# Patient Record
Sex: Female | Born: 1990 | Race: Black or African American | Hispanic: No | Marital: Single | Smoking: Light tobacco smoker
Health system: Southern US, Community
[De-identification: ages and names within clinical notes are randomized; demographics above are authoritative.]

## PROBLEM LIST (undated history)

## (undated) DIAGNOSIS — F329 Major depressive disorder, single episode, unspecified: Secondary | ICD-10-CM

## (undated) DIAGNOSIS — K7460 Unspecified cirrhosis of liver: Secondary | ICD-10-CM

## (undated) DIAGNOSIS — I2699 Other pulmonary embolism without acute cor pulmonale: Secondary | ICD-10-CM

## (undated) DIAGNOSIS — A4902 Methicillin resistant Staphylococcus aureus infection, unspecified site: Secondary | ICD-10-CM

## (undated) DIAGNOSIS — N289 Disorder of kidney and ureter, unspecified: Secondary | ICD-10-CM

## (undated) DIAGNOSIS — I639 Cerebral infarction, unspecified: Secondary | ICD-10-CM

## (undated) DIAGNOSIS — F1410 Cocaine abuse, uncomplicated: Secondary | ICD-10-CM

## (undated) DIAGNOSIS — I509 Heart failure, unspecified: Secondary | ICD-10-CM

## (undated) DIAGNOSIS — F32A Depression, unspecified: Secondary | ICD-10-CM

## (undated) DIAGNOSIS — Z720 Tobacco use: Secondary | ICD-10-CM

## (undated) DIAGNOSIS — Q240 Dextrocardia: Secondary | ICD-10-CM

## (undated) HISTORY — PX: OTHER SURGICAL HISTORY: SHX169

## (undated) HISTORY — PX: FONTAN PROCEDURE, INTRACARDIAC: SHX1654

## (undated) HISTORY — DX: Major depressive disorder, single episode, unspecified: F32.9

## (undated) HISTORY — DX: Cocaine abuse, uncomplicated: F14.10

## (undated) HISTORY — DX: Depression, unspecified: F32.A

## (undated) HISTORY — PX: APPENDECTOMY: SHX54

## (undated) HISTORY — PX: CYST EXCISION: SHX5701

## (undated) HISTORY — DX: Unspecified cirrhosis of liver: K74.60

## (undated) HISTORY — DX: Other pulmonary embolism without acute cor pulmonale: I26.99

## (undated) HISTORY — DX: Tobacco use: Z72.0

---

## 2010-12-20 DIAGNOSIS — Z9889 Other specified postprocedural states: Secondary | ICD-10-CM | POA: Insufficient documentation

## 2010-12-20 DIAGNOSIS — Q220 Pulmonary valve atresia: Secondary | ICD-10-CM | POA: Insufficient documentation

## 2011-02-02 DIAGNOSIS — Q203 Discordant ventriculoarterial connection: Secondary | ICD-10-CM | POA: Insufficient documentation

## 2011-12-04 ENCOUNTER — Emergency Department: Payer: Self-pay | Admitting: Emergency Medicine

## 2011-12-04 LAB — URINALYSIS, COMPLETE
Glucose,UR: NEGATIVE mg/dL (ref 0–75)
Ph: 5 (ref 4.5–8.0)
Protein: 30
Specific Gravity: 1.025 (ref 1.003–1.030)
Squamous Epithelial: 5

## 2011-12-04 LAB — CBC
HCT: 39.4 % (ref 35.0–47.0)
HGB: 12.5 g/dL (ref 12.0–16.0)
MCH: 24.4 pg — ABNORMAL LOW (ref 26.0–34.0)
MCV: 77 fL — ABNORMAL LOW (ref 80–100)
Platelet: 335 10*3/uL (ref 150–440)

## 2011-12-04 LAB — PREGNANCY, URINE: Pregnancy Test, Urine: NEGATIVE m[IU]/mL

## 2011-12-04 LAB — CK TOTAL AND CKMB (NOT AT ARMC)
CK, Total: 23 U/L (ref 21–215)
CK, Total: 97 U/L (ref 21–215)

## 2011-12-04 LAB — BASIC METABOLIC PANEL
BUN: 10 mg/dL (ref 7–18)
Calcium, Total: 8.4 mg/dL — ABNORMAL LOW (ref 8.5–10.1)
Co2: 22 mmol/L (ref 21–32)
EGFR (African American): >60
Sodium: 143 mmol/L (ref 136–145)

## 2011-12-04 LAB — TROPONIN I: Troponin-I: <0.02 ng/mL

## 2011-12-05 LAB — URINE CULTURE

## 2012-05-20 DIAGNOSIS — I2699 Other pulmonary embolism without acute cor pulmonale: Secondary | ICD-10-CM | POA: Insufficient documentation

## 2012-08-19 LAB — COMPREHENSIVE METABOLIC PANEL
Albumin: 2.6 g/dL — ABNORMAL LOW (ref 3.4–5.0)
Anion Gap: 7 (ref 7–16)
Chloride: 106 mmol/L (ref 98–107)
Co2: 23 mmol/L (ref 21–32)
Creatinine: 0.99 mg/dL (ref 0.60–1.30)
EGFR (African American): >60
EGFR (Non-African Amer.): >60
Osmolality: 271 (ref 275–301)
SGOT(AST): 25 U/L (ref 15–37)
SGPT (ALT): 15 U/L (ref 12–78)
Total Protein: 7.8 g/dL (ref 6.4–8.2)

## 2012-08-19 LAB — TSH: Thyroid Stimulating Horm: 1.82 u[IU]/mL

## 2012-08-19 LAB — URINALYSIS, COMPLETE
Glucose,UR: NEGATIVE mg/dL (ref 0–75)
Hyaline Cast: 34
Ketone: NEGATIVE
RBC,UR: 41 /HPF (ref 0–5)
Squamous Epithelial: 20

## 2012-08-19 LAB — CBC WITH DIFFERENTIAL/PLATELET
Basophil: 1 %
Eosinophil: 4 %
HCT: 25 % — ABNORMAL LOW (ref 35.0–47.0)
HGB: 7.4 g/dL — ABNORMAL LOW (ref 12.0–16.0)
MCH: 20.2 pg — ABNORMAL LOW (ref 26.0–34.0)
MCV: 68 fL — ABNORMAL LOW (ref 80–100)
NRBC/100 WBC: 59 /
RBC: 3.67 10*6/uL — ABNORMAL LOW (ref 3.80–5.20)
Segmented Neutrophils: 64 %

## 2012-08-19 LAB — TROPONIN I: Troponin-I: <0.02 ng/mL

## 2012-08-19 LAB — PREGNANCY, URINE: Pregnancy Test, Urine: NEGATIVE m[IU]/mL

## 2012-08-19 LAB — PROTIME-INR: INR: 4.3

## 2012-08-20 ENCOUNTER — Inpatient Hospital Stay: Payer: Self-pay | Admitting: Student

## 2012-08-20 LAB — CK TOTAL AND CKMB (NOT AT ARMC): CK-MB: 1.3 ng/mL (ref 0.5–3.6)

## 2012-08-20 LAB — TROPONIN I: Troponin-I: <0.02 ng/mL

## 2012-08-20 LAB — CBC WITH DIFFERENTIAL/PLATELET
Bands: 1 %
Eosinophil: 2 %
HCT: 26.7 % — ABNORMAL LOW (ref 35.0–47.0)
MCH: 21.3 pg — ABNORMAL LOW (ref 26.0–34.0)
MCHC: 29.8 g/dL — ABNORMAL LOW (ref 32.0–36.0)
MCV: 72 fL — ABNORMAL LOW (ref 80–100)
Monocytes: 10 %
Platelet: 324 10*3/uL (ref 150–440)
RDW: 25.3 % — ABNORMAL HIGH (ref 11.5–14.5)
Segmented Neutrophils: 65 %

## 2012-08-20 LAB — LIPASE, BLOOD: Lipase: 33 U/L — ABNORMAL LOW (ref 73–393)

## 2012-08-20 LAB — CK-MB: CK-MB: 2.1 ng/mL (ref 0.5–3.6)

## 2012-08-21 LAB — MAGNESIUM: Magnesium: 1.7 mg/dL — ABNORMAL LOW

## 2012-08-21 LAB — CBC WITH DIFFERENTIAL/PLATELET
Eosinophil: 2 %
HGB: 8.6 g/dL — ABNORMAL LOW (ref 12.0–16.0)
MCH: 21.6 pg — ABNORMAL LOW (ref 26.0–34.0)
MCHC: 30.6 g/dL — ABNORMAL LOW (ref 32.0–36.0)
MCV: 71 fL — ABNORMAL LOW (ref 80–100)
Monocytes: 8 %
NRBC/100 WBC: 51 /
Platelet: 353 10*3/uL (ref 150–440)
RBC: 3.99 10*6/uL (ref 3.80–5.20)
RDW: 25.5 % — ABNORMAL HIGH (ref 11.5–14.5)
Segmented Neutrophils: 70 %

## 2012-08-21 LAB — BASIC METABOLIC PANEL
Anion Gap: 5 — ABNORMAL LOW (ref 7–16)
Calcium, Total: 7.7 mg/dL — ABNORMAL LOW (ref 8.5–10.1)
Chloride: 108 mmol/L — ABNORMAL HIGH (ref 98–107)
Creatinine: 0.79 mg/dL (ref 0.60–1.30)
EGFR (Non-African Amer.): >60
Glucose: 73 mg/dL (ref 65–99)
Osmolality: 268 (ref 275–301)

## 2012-08-21 LAB — LIPID PANEL
Cholesterol: 61 mg/dL (ref 0–200)
HDL Cholesterol: 11 mg/dL — ABNORMAL LOW (ref 40–60)
Ldl Cholesterol, Calc: 38 mg/dL (ref 0–100)
Triglycerides: 58 mg/dL (ref 0–200)

## 2012-08-21 LAB — PROTIME-INR: Prothrombin Time: 30.6 secs — ABNORMAL HIGH (ref 11.5–14.7)

## 2012-08-21 LAB — TSH: Thyroid Stimulating Horm: 2.13 u[IU]/mL

## 2012-08-22 LAB — PROTIME-INR: INR: 3

## 2012-08-23 ENCOUNTER — Ambulatory Visit (HOSPITAL_COMMUNITY)
Admission: AD | Admit: 2012-08-23 | Discharge: 2012-08-23 | Disposition: A | Discharge status: Home / Self Care | Payer: Self-pay | Source: Other Acute Inpatient Hospital | Attending: Student | Admitting: Student

## 2012-08-23 DIAGNOSIS — R55 Syncope and collapse: Secondary | ICD-10-CM | POA: Insufficient documentation

## 2012-08-23 DIAGNOSIS — D649 Anemia, unspecified: Secondary | ICD-10-CM | POA: Insufficient documentation

## 2012-08-25 LAB — CULTURE, BLOOD (SINGLE)

## 2013-10-21 LAB — URINALYSIS, COMPLETE
BACTERIA: NONE SEEN
Bilirubin,UR: NEGATIVE
Blood: NEGATIVE
Glucose,UR: NEGATIVE mg/dL (ref 0–75)
KETONE: NEGATIVE
Leukocyte Esterase: NEGATIVE
NITRITE: NEGATIVE
Ph: 5 (ref 4.5–8.0)
Protein: 100
Specific Gravity: 1.028 (ref 1.003–1.030)

## 2013-10-21 LAB — BASIC METABOLIC PANEL
Anion Gap: 8 (ref 7–16)
BUN: 15 mg/dL (ref 7–18)
CHLORIDE: 111 mmol/L — AB (ref 98–107)
CREATININE: 0.98 mg/dL (ref 0.60–1.30)
Calcium, Total: 8.7 mg/dL (ref 8.5–10.1)
Co2: 18 mmol/L — ABNORMAL LOW (ref 21–32)
EGFR (African American): >60
EGFR (Non-African Amer.): >60
GLUCOSE: 82 mg/dL (ref 65–99)
Osmolality: 274 (ref 275–301)
Potassium: 4.7 mmol/L (ref 3.5–5.1)
Sodium: 137 mmol/L (ref 136–145)

## 2013-10-21 LAB — HEPATIC FUNCTION PANEL A (ARMC)
AST: 61 U/L — AB (ref 15–37)
Albumin: 3.6 g/dL (ref 3.4–5.0)
Alkaline Phosphatase: 133 U/L — ABNORMAL HIGH
BILIRUBIN DIRECT: 0.4 mg/dL — AB (ref 0.00–0.20)
Bilirubin,Total: 1.5 mg/dL — ABNORMAL HIGH (ref 0.2–1.0)
SGPT (ALT): 21 U/L
Total Protein: 8.3 g/dL — ABNORMAL HIGH (ref 6.4–8.2)

## 2013-10-21 LAB — RAPID HIV SCREEN (HIV 1/2 AB+AG)

## 2013-10-21 LAB — CBC
HCT: 40.5 % (ref 35.0–47.0)
HGB: 11.6 g/dL — AB (ref 12.0–16.0)
MCH: 20.3 pg — ABNORMAL LOW (ref 26.0–34.0)
MCHC: 28.8 g/dL — ABNORMAL LOW (ref 32.0–36.0)
MCV: 71 fL — AB (ref 80–100)
Platelet: 334 10*3/uL (ref 150–440)
RBC: 5.74 10*6/uL — AB (ref 3.80–5.20)
RDW: 24.9 % — ABNORMAL HIGH (ref 11.5–14.5)
WBC: 4.2 10*3/uL (ref 3.6–11.0)

## 2013-10-21 LAB — LIPASE, BLOOD: LIPASE: 62 U/L — AB (ref 73–393)

## 2013-10-21 LAB — TROPONIN I: Troponin-I: 0.02 ng/mL

## 2013-10-22 ENCOUNTER — Inpatient Hospital Stay: Payer: Self-pay | Admitting: Internal Medicine

## 2013-10-22 ENCOUNTER — Ambulatory Visit: Payer: Self-pay | Admitting: Neurology

## 2013-10-22 LAB — PROTIME-INR
INR: 1.7
Prothrombin Time: 19.7 secs — ABNORMAL HIGH (ref 11.5–14.7)

## 2013-10-22 LAB — DRUG SCREEN, URINE
Amphetamines, Ur Screen: NEGATIVE (ref ?–1000)
Barbiturates, Ur Screen: NEGATIVE (ref ?–200)
Benzodiazepine, Ur Scrn: NEGATIVE (ref ?–200)
Cannabinoid 50 Ng, Ur ~~LOC~~: POSITIVE (ref ?–50)
Cocaine Metabolite,Ur ~~LOC~~: NEGATIVE (ref ?–300)
MDMA (Ecstasy)Ur Screen: NEGATIVE (ref ?–500)
Methadone, Ur Screen: NEGATIVE (ref ?–300)
Opiate, Ur Screen: NEGATIVE (ref ?–300)
Phencyclidine (PCP) Ur S: NEGATIVE (ref ?–25)
Tricyclic, Ur Screen: NEGATIVE (ref ?–1000)

## 2013-10-22 LAB — TSH: Thyroid Stimulating Horm: 1.37 u[IU]/mL

## 2013-10-23 LAB — PROTIME-INR
INR: 1.8
Prothrombin Time: 20.5 secs — ABNORMAL HIGH (ref 11.5–14.7)

## 2013-10-23 LAB — URINE CULTURE

## 2013-11-02 ENCOUNTER — Emergency Department: Payer: Self-pay | Admitting: Emergency Medicine

## 2013-11-02 ENCOUNTER — Ambulatory Visit (HOSPITAL_COMMUNITY)
Admission: AD | Admit: 2013-11-02 | Discharge: 2013-11-02 | Disposition: A | Discharge status: Home / Self Care | Payer: Medicaid Other | Source: Other Acute Inpatient Hospital | Attending: Emergency Medicine | Admitting: Emergency Medicine

## 2013-11-02 DIAGNOSIS — I2699 Other pulmonary embolism without acute cor pulmonale: Secondary | ICD-10-CM | POA: Insufficient documentation

## 2013-11-02 LAB — BASIC METABOLIC PANEL
ANION GAP: 17 — AB (ref 7–16)
BUN: 13 mg/dL (ref 7–18)
CALCIUM: 8.9 mg/dL (ref 8.5–10.1)
CHLORIDE: 104 mmol/L (ref 98–107)
CO2: 16 mmol/L — AB (ref 21–32)
CREATININE: 0.9 mg/dL (ref 0.60–1.30)
EGFR (African American): >60
Glucose: 73 mg/dL (ref 65–99)
Osmolality: 273 (ref 275–301)
POTASSIUM: 4.3 mmol/L (ref 3.5–5.1)
Sodium: 137 mmol/L (ref 136–145)

## 2013-11-02 LAB — PRO B NATRIURETIC PEPTIDE: B-Type Natriuretic Peptide: 598 pg/mL — ABNORMAL HIGH (ref 0–125)

## 2013-11-02 LAB — CBC
HCT: 41.6 % (ref 35.0–47.0)
HGB: 11.7 g/dL — ABNORMAL LOW (ref 12.0–16.0)
MCH: 19.8 pg — AB (ref 26.0–34.0)
MCHC: 28.1 g/dL — AB (ref 32.0–36.0)
MCV: 71 fL — ABNORMAL LOW (ref 80–100)
Platelet: 259 10*3/uL (ref 150–440)
RBC: 5.91 10*6/uL — ABNORMAL HIGH (ref 3.80–5.20)
RDW: 25.5 % — ABNORMAL HIGH (ref 11.5–14.5)
WBC: 4.4 10*3/uL (ref 3.6–11.0)

## 2013-11-02 LAB — URINALYSIS, COMPLETE
BLOOD: NEGATIVE
Bilirubin,UR: NEGATIVE
Glucose,UR: NEGATIVE mg/dL (ref 0–75)
Ketone: NEGATIVE
LEUKOCYTE ESTERASE: NEGATIVE
Nitrite: NEGATIVE
Ph: 5 (ref 4.5–8.0)
Protein: 30
Squamous Epithelial: 10

## 2013-11-02 LAB — APTT: ACTIVATED PTT: 38.3 s — AB (ref 23.6–35.9)

## 2013-11-02 LAB — TROPONIN I: Troponin-I: 0.03 ng/mL

## 2013-11-02 LAB — PROTIME-INR
INR: 2
Prothrombin Time: 22.4 secs — ABNORMAL HIGH (ref 11.5–14.7)

## 2014-03-28 DIAGNOSIS — F1410 Cocaine abuse, uncomplicated: Secondary | ICD-10-CM | POA: Insufficient documentation

## 2014-03-28 DIAGNOSIS — R079 Chest pain, unspecified: Secondary | ICD-10-CM | POA: Insufficient documentation

## 2014-03-30 DIAGNOSIS — F17200 Nicotine dependence, unspecified, uncomplicated: Secondary | ICD-10-CM | POA: Insufficient documentation

## 2014-04-04 ENCOUNTER — Emergency Department: Payer: Self-pay | Admitting: Emergency Medicine

## 2014-04-07 ENCOUNTER — Emergency Department: Payer: Self-pay | Admitting: Emergency Medicine

## 2014-05-20 ENCOUNTER — Emergency Department
Admit: 2014-05-20 | Disposition: A | Discharge status: Home / Self Care | Payer: Self-pay | Admitting: Emergency Medicine

## 2014-05-20 LAB — BASIC METABOLIC PANEL
Anion Gap: 7 (ref 7–16)
BUN: 16 mg/dL
CHLORIDE: 107 mmol/L
CREATININE: 0.57 mg/dL
Calcium, Total: 9.3 mg/dL
Co2: 22 mmol/L
Glucose: 98 mg/dL
Potassium: 4.1 mmol/L
Sodium: 136 mmol/L

## 2014-05-20 LAB — CBC WITH DIFFERENTIAL/PLATELET
Basophil #: 0.1 10*3/uL (ref 0.0–0.1)
Basophil %: 1.2 %
EOS ABS: 0.2 10*3/uL (ref 0.0–0.7)
Eosinophil %: 3.1 %
HCT: 46.9 % (ref 35.0–47.0)
HGB: 14.9 g/dL (ref 12.0–16.0)
Lymphocyte #: 1.7 10*3/uL (ref 1.0–3.6)
Lymphocyte %: 32.1 %
MCH: 29.1 pg (ref 26.0–34.0)
MCHC: 31.7 g/dL — AB (ref 32.0–36.0)
MCV: 92 fL (ref 80–100)
MONO ABS: 0.4 x10 3/mm (ref 0.2–0.9)
Monocyte %: 7.5 %
NEUTROS ABS: 2.9 10*3/uL (ref 1.4–6.5)
Neutrophil %: 56.1 %
Platelet: 299 10*3/uL (ref 150–440)
RBC: 5.11 10*6/uL (ref 3.80–5.20)
RDW: 17.5 % — ABNORMAL HIGH (ref 11.5–14.5)
WBC: 5.3 10*3/uL (ref 3.6–11.0)

## 2014-05-24 DIAGNOSIS — Z9889 Other specified postprocedural states: Secondary | ICD-10-CM | POA: Insufficient documentation

## 2014-06-12 NOTE — Consult Note (Signed)
[name] is a 24 year old African American single female, P0, LMP 08/01/12, using condoms as birthcontrol.  Patient [facility] yesterday morning complaining of chest pain, syncope, and shortness of breath.  [facility] was [facility] hospitalist for menorrhagia and syncope.  Patient states LMP begain on 08/01/12 and lasted for 14 days with heavy bleeding.  Patient states prior to LMP, her cycles have been regular every 4 weeks with minimal blood loss.  Age of menarche [age].  At this time, patient afebrile and denies blood loss.  Patient denies dysmenorrhea, abdominal and/or pelvic pain, vaginal discharge, hot flashes, or sweats.  Patient denies previous STI exposure.  Patient states she has never seen an OB/GYN.is s/p transfusion 1 unit prbc's. 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 History: negative History: Student. Lives with mother.  Smokes 1 pack of cigarettes per week.  Denies alcohol, illicit drugs. Xarelto 20mg  q day, spironolactone 25 1/2 tablet q day, Lasix 20mg  q day, citalopram 40mg  q day, Coreg 1 tab BID NKDA Labs: RBC 3.73, Hmg, 8, Hct 26.7, platelets 324, urine pregnancy screen negative   PE: pending ([place] in [place]) Order pelvic USBegin iron supplement alone or with multivitaminConsider progestin hormone supplementation (i.e. Mirena, Depot Provera, Nexplanon) to prevent recurrence.Continue antibiotics for UTI; check Urine C&S.   Electronic Signatures: DeFrancesco, Prentice DockerMartin A (MD) (Signed on 01-Jul-14 17:47)  Ernesto RutherfordAuthored  Auriel Kist (NP) (Signed on 01-Jul-14 17:04)  Authored   Last Updated: 01-Jul-14 17:47 by DeFrancesco, Prentice DockerMartin A (MD)

## 2014-06-12 NOTE — Consult Note (Signed)
Chief Complaint:  Subjective/Chief Complaint Pt notes upper abdominal cramps 8/10.  Denies N/V or rectal bleeding.  No BM in 2 days.  Immune to Hep A/B.  AFP normal.   VITAL SIGNS/ANCILLARY NOTES: **Vital Signs.:   03-Jul-14 07:55  Vital Signs Type Routine  Temperature Temperature (F) 98.4  Celsius 36.8  Temperature Source oral  Pulse Pulse 72  Respirations Respirations 16  Systolic BP Systolic BP 107  Diastolic BP (mmHg) Diastolic BP (mmHg) 59  Mean BP 75  Pulse Ox % Pulse Ox % 92  Pulse Ox Activity Level  At rest  Oxygen Delivery 3L   Brief Assessment:  GEN well developed, well nourished, no acute distress, A/Ox3   Cardiac Regular   Respiratory normal resp effort   Gastrointestinal details normal Soft  Nondistended  No masses palpable  Bowel sounds normal  No rebound tenderness  No gaurding  No rigidity  +mild TTP entire abdomen   EXTR negative edema   Additional Physical Exam Skin: warm, dry, multiple hyperpigmented lesions, tattoos   Lab Results: Routine Chem:  03-Jul-14 03:56   Result Comment HGB - SLIDE PREVIOUSLY REVIEWED BY PATHOLOGIST  - FOR NRBCs  Result(s) reported on 22 Aug 2012 at 09:04AM.  Routine Coag:  03-Jul-14 03:56   Prothrombin  29.9  INR 3.0 (INR reference interval applies to patients on anticoagulant therapy. A single INR therapeutic range for coumarins is not optimal for all indications; however, the suggested range for most indications is 2.0 - 3.0. Exceptions to the INR Reference Range may include: Prosthetic heart valves, acute myocardial infarction, prevention of myocardial infarction, and combinations of aspirin and anticoagulant. The need for a higher or lower target INR must be assessed individually. Reference: The Pharmacology and Management of the Vitamin K  antagonists: the seventh ACCP Conference on Antithrombotic and Thrombolytic Therapy. Chest.2004 Sept:126 (3suppl): [id number]. A HCT value >55% may artifactually increase the  PT.  In one study,  the increase was an average of 25%. Reference:  "Effect on Routine and Special Coagulation Testing Values of Citrate Anticoagulant Adjustment in Patients with High HCT Values." American Journal of Clinical Pathology 2006;126:400-405.)  Routine Hem:  03-Jul-14 03:56   Hemoglobin (CBC)  9.5   Radiology Results: CT:    01-Jul-14 22:44, CT Abdomen and Pelvis With Contrast  CT Abdomen and Pelvis With Contrast   REASON FOR EXAM:    (1) evaluate liver, ascites, nausea, vomiting, HSM;   (2) anemia, N/V, pelvic asci  COMMENTS:       PROCEDURE: CT  - CT ABDOMEN / PELVIS  W  - Aug 20 2012 10:44PM     RESULT: Comparison:  None    Technique: Multiple axial images of the abdomen and pelvis were performed   from the lung bases to the pubic symphysis, without p.o. contrast and   with 100 mL of Isovue 300 intravenous contrast.    Findings:  Mild basilar opacities are likely secondary to atelectasis. There are   trace bilateral pleural effusions, right greater than left. There is     dextrocardia. A graft is seen from the IVC extending superiorly beyond   the field-of-view which was shown to extend to the pulmonary artery on   recent prior CT. There is hypodensity within the graft which could   related to mixing artifact versus at least partial thrombosis.    The liver is enlarged and demonstrates diffuse heterogeneous enhancement.   This could related to hepatic venous congestion, as can be seen with  nutmeg liver. There are multiple small hyperenhancing nodules throughout   the liver. The largest measures 12 mm in diameter. The main portal vein   is patent. No definite evidence of hepatic venous thrombosis on the   delayed images.    There is a moderate amount of ascites. The gallbladder, adrenals, and   pancreas are unremarkable. The spleen is absent. The kidneys enhance   normally.  The colon is in the left hemiabdomen with small bowel in the right   hemiabdomen,  consistent with rotation. No evidence of volvulus. There is   a small tubular structure in the left adnexa which measures approximately   2.5 x 1.2 cm. This could represent hydrosalpinx. Excreted contrast   material is seen within the bladder from recent prior CT. There is mild   thickening of the bladder wall which could be secondary to lack of   distention.    No aggressive lytic or sclerotic osseous lesions are identified.    IMPRESSION:   1. The liver is diffusely enlarged and demonstrates heterogeneous   enhancement. This can be secondary to the nutmeg liver from hepatic   venous congestion. There is a moderate amount of ascites. No definite   evidence of hepatic venous thrombosis. Recommend comparison with outside     prior studies.   2. There is some low-attenuation within the graft that extends from the   IVC to the pulmonary arteries which could be related to mixing artifact.   However, at least partial thrombosis within the graft cannot be excluded.   Further evaluation with cardiac MRI at a tertiary center maybe   beneficial.  3. There are multiple small hyperenhancing nodules in the liver. This are   nonspecific and differential would include FNH, adenomas, regenerating   nodules, and small hepatocellular carcinomas. Comparison with outside   prior studies and followup is recommended.  4. There is a small cystic structure in the left adnexa which could   represent a hydrosalpinx or pyosalpinx.  5. Mild bladder wall thickening is nonspecific. Correlate for cystitis.      Verified By: Lewie Chamber, M.D., MD   Assessment/Plan:  Assessment/Plan:  Assessment Abdominal pain:  Persistent mild abd pain.  Recent CT shows UTI, hydrosalpinx.  N/V resolved. 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  AFP negative.  Pt immune to Hep A/B. Anemia: likely secondary to menorrhagia.  Per GYN.  No hx gross GI bleeding.  Hemoccult  not done yet.   Plan 1) FU hemoccult 2) PPI daily 3) Consider EGD if upper abd pain persists or hemoccult positive 4) Dr Mechele Collin will follow pt over holiday weekend 5) Pt will need FU with [facility] for chronic liver & cardiac issues   Electronic Signatures: Joselyn Arrow (NP)  (Signed 03-Jul-14 10:22)  Authored: Chief Complaint, VITAL SIGNS/ANCILLARY NOTES, Brief Assessment, Lab Results, Radiology Results, Assessment/Plan   Last Updated: 03-Jul-14 10:22 by Joselyn Arrow (NP)

## 2014-06-12 NOTE — Consult Note (Signed)
PATIENT NAMEKIAIRA, [name] MR#:  [id number] DATE OF BIRTH:  1990/09/12  DATE OF CONSULTATION:  08/20/2012  REFERRING PHYSICIAN:  Dr. Tressia Miners.  CONSULTING PHYSICIAN:  Lucilla Lame, MD/Jan Olano Evalina Field, NP  PRIMARY CARE PHYSICIAN: Dr. Larae Grooms.   GASTROENTEROLOGIST: Dr. Allen Norris.   CARDIOLOGIST: [facility]. Previously [facility] and Dr. Bartholome Bill.    GYNECOLOGIST: Dr. Enzo Bi.   REASON FOR CONSULTATION: Nausea, vomiting and abdominal pain.   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 She was found to be anemic with a hemoglobin of 7.4, last hemoglobin was 8. She was also found to be coagulopathic with an INR 4.3. She does have normal platelets. She tells me she has had nausea and vomiting several times since 08/15/2012. She did have an ultrasound which showed an echogenic liver, and hepatosplenomegaly seen on CT scan was not seen on this ultrasound but there were changes suggestive of cirrhosis and hepatic dysfunction with gallbladder wall thickening, possible ascites. A CT of the chest showed dextrocardia with surgical repair and changes in the liver consistent with atypical hemangiomas versus FNH and trace right pleural effusion. She gives no history of hepatitis. She does have several tattoos. She did have a recent blood transfusion. She denies any heartburn or indigestion. She is having several loose bowel movements daily but less than 4. She denies any rectal bleeding or melena. She did have a negative urine pregnancy test. She was diagnosed with gram-negative rod UTI. Her lipase was normal. Her LFTs were normal. Her last menstrual  period started about 2 weeks ago, and she has had heavy bleeding and clots since approximately day 2. She has been on Xarelto for about 2 months for a pulmonary embolus and prior to that did not have heavy flow.   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   MEDICATIONS PRIOR TO ADMISSION: Carvedilol 25 mg b.i.d., citalopram 40 mg daily, furosemide 20 mg daily, spironolactone 25 mg 1/2 tablet daily, Xarelto 20 mg daily.   ALLERGIES: No known drug allergies.   FAMILY HISTORY: There is no known family history of colorectal carcinoma, liver or chronic GI problems.   SOCIAL HISTORY: She is single. She lives with her mother. She rarely smokes. She denies any alcohol or drug use. She is disabled.   REVIEW OF SYSTEMS: See HPI.  GYN. She does have heavy periods since starting Xarelto.  PULMONARY: She did have some shortness of breath on exertion.  CARDIOVASCULAR: Intermittent episodes of chest pain. Denies palpitations.  HEMATOLOGY: She denies easy bruising or bleeding.   Otherwise, negative complete review of systems.   PHYSICAL EXAMINATION:  VITAL SIGNS: Temp 98.1, pulse 69, respirations 18, blood pressure 107/61, O2 sat 92% on 4 liters per minute.   GENERAL: She is a well-developed, well-nourished, black female in no acute distress.  HEENT: Sclerae clear, nonicteric. Conjunctivae pink. Oropharynx pink and moist without any lesions.  NECK: Supple without any mass or thyromegaly.  CHEST: Heart regular rate and rhythm.  LUNGS: Clear to auscultation bilaterally.  ABDOMEN: Positive bowel sounds x4. No bruits auscultated. Abdomen is soft, nontender, nondistended, without palpable mass or hepatosplenomegaly. No rebound  tenderness or guarding.  EXTREMITIES: Without edema or cyanosis.  PSYCHIATRIC: She is alert, oriented, pleasant, Normal mood and affect.  SKIN: Warm and dry. She has multiple bullae to her face, upper and lower extremities and abdomen.  NEUROLOGIC: Grossly intact.   LABORATORY STUDIES: Calcium 8.1, otherwise normal met-7. Lipase 33. Albumin 2.6, otherwise normal LFTs. Troponin x3 negative. CK and CK-MB negative. TSH 1.82. White blood cell count 7.7, hemoglobin 8, hematocrit 26.7, platelets 324, MCV 72. She has anisocytosis, poikilocytosis, target cells, elliptocytes, polychromasia, microcytes and variable platelet size noted on her differential. She had an INR of 4.3 and a prothrombin time of 39.4. Urinalysis is positive for gram-negative rods greater than 100,000, and she had a negative urine pregnancy test.   IMAGING: See HPI.    IMPRESSION: [name] is a pleasant, 24 year old, black female with history of congenital heart defect repairs, dextrocardia, anemia and pulmonary embolus on Xarelto. Admitted with abdominal pain, nausea, vomiting and urinary tract infection. On chest CT and ultrasound imaging, she has hepatosplenomegaly, pelvic ascites and possible atypical hemangioma versus FNH of the liver. She is also coagulopathic with an INR of 4.3, likely due to Xarelto rather than liver disease given intact platelet count, no evidence of shrunken liver or advanced cirrhosis on imaging. CT of abdomen and pelvis with intravenous and oral contrast for further evaluation of her liver as well as ascites. Hepatosplenomegaly and increased hepatic echotexture could be due to pulmonary embolus, possible pulmonary hypertension, and right heart congestive hepatopathy. Urinary tract infection likely culprit of nausea, vomiting, abdominal pain and has significantly resolved with antibiotic therapy. Liver function tests are normal. Menorrhagia is the most likely culprit of her anemia in the setting of coagulopathy and  Xarelto. I have discussed her complex care with Dr. Allen Norris. She may be transferred to [place] once a bed is available as well.   PLAN:  1. CT of abdomen and pelvis with IV and oral contrast.  2. If evidence of cirrhosis, would consider complete serologic workup for liver disease, viral hepatitis A and B immunity, etc.  3. INR in the a.m.  4. Consider discontinuing Xarelto and starting heparin drip since Xarelto likely culprit of elevated INR. Another anticoagulant agent may be needed, but ultimately we will defer this decision to primary care and cardiology.  5. PPI daily.  6. Hemoccult stools. If positive, consider EGD.  7. Agree with GYN evaluation.   We would like to thank you for allowing [place] to participate in the care of [name].    ____________________________ Andria Meuse, NP klj:[id number] D: 08/20/2012 23:03:40 ET T: 08/21/2012 01:50:23 ET JOB#: [id number]  cc: Andria Meuse, NP, <Dictator> Latina Craver, MD Lucilla Lame, MD Alanda Slim. DeFrancesco, MD [place] FNP ELECTRONICALLY SIGNED 08/28/2012 12:33

## 2014-06-12 NOTE — Consult Note (Signed)
CC: gram positive sepsis.  Pt ate breakfast, wants meat.  Has some abd tenderness and obvious ascites on exam.  Appropriate and alert.  Had vomiting once yesterday after breakfast but not since.  Congestive hepatomegaly with nodules on CT.  On anticoagulation.  AFP was normal.  VSS, WBC 8.5, hgb 8.6  US showed wall thichening but likely due to effect of ascites.  No peritoneal signs.  Continue current course.  Electronic Signatures: Scot JunElliott, Robert T (MD)  (Signed on 04-Jul-14 13:00)  Authored  Last Updated: 04-Jul-14 13:00 by Scot JunElliott, Robert T (MD)

## 2014-06-12 NOTE — Discharge Summary (Signed)
PATIENT NAME:  [name], [name] MR#:  [id number] DATE OF BIRTH:  23-Sep-1990  DATE OF ADMISSION:  08/20/2012 DATE OF DISCHARGE:  08/23/2012  DISPOSITION:  The patient will be getting transferred to the cardiology service, Dr. Elesa [facility]edWard, accepting physician, [facility].  CHIEF COMPLAINT: Near-syncope, anemia.   CURRENT DIAGNOSES:  1.  Gram-positive bacteremia, 2 out of 2 bottles, probably methicillin-resistant Staphylococcus aureus.  2.  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  3.  Acute and chronic anemia.  On arrival status post PRBC transfusion.  4.  Dizziness and shortness of breath on arrival partly secondary to acute on chronic anemia.  5.  Abdominal pain with nausea with enlarged liver and ascites, possibly a source.  6.  History of pulmonary embolus, on Xarelto.  7.  Significant menorrhagia status post Depo shot here.  8.  Urinary tract infection, currently on Keflex.  9.  Hypoxia with non-diagnostic CT PE protocol.  10.  History of significant congenital cardiac disease status post several surgical repairs including history of pulmonary atresia with ventricular septal defect,  dextrocardia and transposition of great arteries status post Blalock procedure as infant. Probable Sherrine MaplesGlenn shunt and status post Fontan procedure.  11.  History of stroke.   CURRENT MEDICATIONS: 1.  Vancomycin 1 gram IV x 1 followed by q. 12 hour dosing started earlier today. 2.  Xarelto 20 mg daily.  3.  Spironolactone 25 mg b.i.d. with meals.  4.  Pravastatin 20 mg daily.  5.  Pyridium 100 mg t.i.d.  6.  Pantoprazole 40 mg q. 6 a.m.  7.  Lasix 20 mg daily.  8.  Citalopram 40 mg daily.  9.  Keflex 250 mg q. 8 hours.  10.  Coreg 25 mg b.i.d.  11.  Morphine 1 to 2 mg IV q. 4 hours p.r.n. for pain.  12.  Other p.r.n. medications are:  Albuterol/ipratropium, oxycodone and Zofran.  SIGNIFICANT LABS AND IMAGING: CT chest via PE protocol imaging showed  dextrocardia with complex congenital cardiac anomalies and surgical repair. Study is nondiagnostic for pulmonary embolus secondary to lack of opacification of the pulmonary arteries likely related to pattern of congenital anomaly. Small, round hyper-enhancing foci in the liver. May represent small atypical hemangioma or focal nodular hyperplasia. Trace right pleural effusion.   CT of abdomen and pelvis with contrast showed liver being diffusely enlarged and heterogeneous.  This could be secondary to nutmeg liver from hepatic venous congestion. There is moderate amount of ascites. No definitive evidence of hepatic venous thrombosis.  Low attenuation within the graft that extends from the IVC to the pulmonary arteries, which could be related to mixing artifact, however, at least partial thrombus within the graft cannot be excluded. Multiple small hyper-enhancing nodules in the liver. Small cystic structure in the left adnexa which could represent hydrosalpinx or pyosalpinx.  Mild bladder wall thickening, which is nonspecific.   Echocardiogram on 07/02 showed EF f about 35% to 40%, single ventricle, and status post Blalock procedure, Fontan procedure and Glenn procedure.  [facility] for better evaluation.  Initial hemoglobin 7.4, hematocrit 25, MCV 68.  Initial TSH 1.8. Troponins negative x 3. CK-MB negative x 2 on arrival. Albumin 2.6, otherwise LFTs on arrival within normal limits. Initial BUN 12, creatinine 0.99.  Lipase on arrival was 33. HDL 11, magnesium 1.7 and LDL of 38, all on 07/02.  Blood cultures, 2 out of 2 positive, on  07/03, with gram-positive cocci in clusters. AFB tumor marker 2.2. Hepatitis B surface antibody is reactive.  Hepatis surface antigen is negative. Hepatitis C antibody total positive. Hepatitis C viral antibody is negative. Pregnancy test negative.   HISTORY OF PRESENT ILLNESS AND HOSPITAL COURSE: For full  details of H and P, please see the dictation on 07/01 by Dr. Amado Coe, but briefly this is a pleasant 24 year old female with complicated cardiac history as described above who came in with dizziness. She was having some lightheadedness without syncope and came in and was found to have hemoglobin of 7.4, being on Xarelto.  She described heavy menses and was admitted to the hospitalist service. Initial EKG showed ST elevation MI, per ER physician; however, Dr. Lady Gary from cardiology was consulted and ST elevations were deemed to not be ST elevation MI.  Initially, it was thought that her care was being provided at [facility] and [facility] was consulted for a transfer; however, they were on diversion and did not except at that time. She was therefore admitted to the hospitalist service, given a unit of PRBC, and OB/GYN was consulted. The patient was seen by our OB/GYN, Dr. Greggory Keen, and was given a Depot Provera injection. She was also seen by Dr. Lady Gary from cardiology. An echocardiogram was obtained, which was difficult to interpret here and tertiary care cardiology transfer was recommended. She has been hypoxic here and the result has been not determined. She possibly was supposed to be on 3 liters of oxygen per previous hospitalist's notes; however, she denies having been on oxygen. She underwent a CT PE protocol which was non-diagnostic. There was no pneumonia. It is possible that this is from her congenital cardiac issues, but she has required to 3 liters of oxygen to maintain sats over 90. She de-sats to 85% or so without oxygen. She did have a UTI and was on ceftriaxone and that has been transitioned into Keflex as the urine cultures are growing pansensitive E. coli. The patient did spike a fever on 07/03 and blood cultures were sent. They both became positive with gram-positive cocci in clusters. The patient apparently has a history of MRSA in the past and this could be MRSA.  She is currently on contact isolation bed. Her  hemoglobin has trended up to 9.5 with PRBC transfusion. On 07/02, the last day of CBC check, her white count was 8.5. Given the complicated cardiac history and gram-positive cocci in clusters, possibly MRSA, and findings of non-diagnostic CAT scan and echo, she will be discharged to [facility] and has been accepted by Dr. Elesa Massed from cardiology.   DATE OF DISCHARGE: Stable.   TIME SPENT: Dictating and arranging discharge paper work, 35 minutes.   ____________________________ Krystal Eaton, MD sa:sb D: 08/23/2012 15:49:12 ET T: 08/23/2012 16:23:31 ET JOB#: [id number]  cc: Krystal Eaton, MD, <Dictator> Krystal Eaton MD ELECTRONICALLY SIGNED 08/30/2012 15:06

## 2014-06-12 NOTE — Consult Note (Signed)
Brief Consult Note: Diagnosis: acute N/V/abdominal pain.   Patient was seen by consultant.   Consult note dictated.   Comments: Ms. [name] is a pleasant 24 y/o black female with hx congenital heart defect repairs, dextrocardia, anemia & PE on Xarelto admitted with abdominal pain, nausea, vomiting & UTI.  On CT chest & ultrasound imaging she has hepatosplenomegaly, pelvic ascites & possible atypical hemangioma versus FNH of liver.  She is also coagulopathic with INR 4.3 likely due to Xarelto rather than liver disease given intact platelet count, no evidence of shrunken liver or advanced cirrhosis.  CT for further evaluation.  Hepatosplenomegaly & increased hepatic echotexture could be due to PE, possible pulmonary HTN, & right heart congestive hepatopathy.  UTI likely culprit of N/V/Abd pain & has significantly resolved with antibiotic therapy.  Liver function tests are normal.  Menorrhagia most likely culprit of anemia in setting of coagulopathy,  I have discussed her complex care with Dr Servando SnareWohl.  [facility].  Plan: 1) CT A/P with IV/oral contrast 2) If evidence of cirrhosis, would consider complete serologic work-up for liver disease, viral hepatitis A/B immunity 3) INR AM 4) Consider discontinuing xarelto & starting heparin gtt since xarelto likely culprit of elevated INR.  Agent may need to be changed but will ultimately defer to PCP/cardiology on this. 5) PPI daily 6) hemoccult stools, if positive consider EGD 7) Agree w/ GYN eval  Thanks for consult.  Please see full dictated note. #[id number].  Electronic Signatures: Joselyn ArrowJones, Rayshun Kandler L (NP)  (Signed 01-Jul-14 23:04)  Authored: Brief Consult Note   Last Updated: 01-Jul-14 23:04 by Joselyn ArrowJones, Gerldine Suleiman L (NP)

## 2014-06-12 NOTE — H&P (Signed)
PATIENT NAMLodema Walton:  Walton, SHAQUANNE MR#:  [id number] DATE OF BIRTH:  [birth year]  DATE OF ADMISSION:  08/20/2012  PRIMARY CARE PHYSICIAN: None.   REFERRING PHYSICIAN: Dr. Cyril LoosenKinner.   CHIEF COMPLAINT: Dizziness.   HISTORY OF PRESENT ILLNESS: The patient is a 24 year old African American female with a past medical history of stroke and dextrocardia, is presenting to the ER with a chief complaint of dizziness. The patient is reporting that she was feeling lightheaded since yesterday afternoon at around 3 to 4 p.m. She denies any passing out. As the dizziness is persistent, associated with throat pain and abdominal pain, she comes into the ER. Diagnosed with anemia with a hemoglobin of 7.4. The patient is on Xarelto for a history of pulmonary embolism and reporting that she bleeds heavily during her period. She gets her period every 4 weeks and period lasts for 2 weeks with heavy bleeding and passing clots. The patient was not seen by any OB/GYN so far. The patient is still continuing to take Xarelto. Right now, she is not actively bleeding. The patient is having intermittent episodes of chest pain. A 12-lead EKG was done which showed left axis deviation and ST segment elevations in V leads. As there was a concern for acute MI, it was shown to cardiology, Dr. Lady GaryFath, by the ER physician assistant, and Dr. Lady GaryFath has reviewed that and felt that the patient is not having any STEMI. The [facility] but as [facility] is at capacity, our hospitalist team is called to admit the patient. CT angiogram of the chest is done and pulmonary embolism is ruled out. One unit of blood transfusion is ordered.    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   PAST SURGICAL HISTORY: Cardiac surgery x2 for congenital anomaly.   PSYCHOSOCIAL HISTORY: Lives at home with mom. Denies any smoking, alcohol or illicit drug  usage.   FAMILY HISTORY: Mom is healthy. Denies any medical problems.   HOME MEDICATIONS: Xarelto 20 mg 1 tablet once a day, spironolactone 25 mg 1/2 tablet once daily, furosemide 20 mg once daily, citalopram 40 mg once daily, Coreg 25 mg 1 tablet 2 times a day.   REVIEW OF SYSTEMS:  CONSTITUTIONAL: Denies fever. Complaining of fatigue and weakness. Complaining of abdominal pain in the lower part.  EYES: Denies any blurry vision, glaucoma, cataracts.  EARS, NOSE, THROAT: No hearing loss or ear pain but complaining of throat pain.  RESPIRATION: Denies any cough or wheezing. No COPD.   CARDIOVASCULAR: Complaining of intermittent episodes of chest pain. No palpitations. Denies any syncope but has dizziness.  GASTROINTESTINAL: No nausea, vomiting, diarrhea, hematemesis or melena.  GENITOURINARY: No hematuria but complaining of frequent urination.  GYNECOLOGIC AND BREASTS: No breast mass or vaginal discharge but complaining of heavy periods, each period lasting for 2 weeks.  ENDOCRINE: Denies any polyuria, nocturia, thyroid problems.  HEMATOLOGIC AND LYMPHATIC: The patient is anemic. No easy bruising or bleeding.  INTEGUMENTARY: No acne or rash is present but multiple excoriations are present from scratching a lot.  MUSCULOSKELETAL: No joint pain in the neck, back, shoulder. Denies any gout.  NEUROLOGIC: Old history of stroke is present but no deficits. No ataxia or dementia.  PSYCHIATRIC: No ADD, OCD, bipolar disorder.   PHYSICAL EXAMINATION:  VITAL SIGNS: Temperature 99.1, pulse 70, respirations 18, blood pressure 104/54, pulse ox 95%.  GENERAL APPEARANCE: Not in any acute distress.  Moderately built and moderately nourished.   HEENT: Normocephalic, atraumatic. Pupils are equally reacting to light and accommodation. No scleral icterus. No conjunctival injection. No sinus tenderness. On examination of the throat, positive tonsillar exudates. No postnasal drip.  NECK: Supple. No JVD. No  thyromegaly. No lymphadenopathy.  LUNGS: Clear to auscultation bilaterally. No accessory muscle usage. No anterior chest wall tenderness on palpation.  CARDIAC: S1, S2 normal. Regular rate and rhythm. No murmurs.  GASTROINTESTINAL: Soft. Bowel sounds are positive in all 4 quadrants. Minimal suprapubic tenderness is present but no rebound tenderness. No hepatosplenomegaly. No masses felt.  NEUROLOGIC: Awake, alert, oriented x3. Cranial nerves II through XII were intact. Motor and sensory are intact. Reflexes are 2+.  EXTREMITIES: No edema. No cyanosis. No clubbing.  SKIN: Warm to touch. Normal turgor. No rashes but multiple excoriations are present as the patient is scratching her skin.  MUSCULOSKELETAL: No joint effusion, tenderness or erythema.  PSYCHIATRIC: Normal mood and affect.   LABS AND IMAGING STUDIES: CT OF THE CHEST:  1. Dextrocardia with complex congenital cardiac anomalies and surgical repair. The study is nondiagnostic for pulmonary embolism secondary to the lack of opacification of the pulmonary arteries likely related to flow pattern of the congenital anomaly.  2. Focal nodular hyperplasia is present. Further evaluation with contrast enhanced MRA is recommended. Trace right-sided pleural effusion is present.   BMP is normal except for calcium which is low at 8.1. LFTs are also normal except for albumin which is low at 2.6. Troponin less than 0.02. TSH is normal at 1.82. Urine pregnancy test is negative. Urinalysis cloudy in appearance, glucose negative, bilirubin 2, ketones negative, specific gravity 1.024, nitrite negative, leukocyte esterase 2+. hemoglobin is 7.4, hematocrit 25.2, platelets 387.   ASSESSMENT AND PLAN: 1. Near syncope from symptomatic anemia secondary to menorrhagia. Also, the patient is on Xarelto. Will provide her intravenous fluids and 1 unit of blood transfusion tonight. Will repeat CBC in a.m. Cycle cardiac biomarkers as the patient is complaining of chest pain.  Cardiology consult is placed to Dr. Lady Gary. Gynecology consult is also placed regarding the patient's menorrhagia. 2. History of pulmonary embolism: The patient is currently on Xarelto but not actively bleeding, so we will continue Xarelto and consult cardiology regarding further continuation in the future.  3. Menorrhagia: Gynecology is consulted.  4. History of stroke with no deficits: Continue aspirin.  5. Chest pain, rule out acute coronary syndrome: Will get cardiac enzymes and will implement acute coronary syndrome protocol. Cardiology consult is placed to Dr. Lady Gary.  6. Holding Lasix in view of hypotension.   CODE STATUS: She is FULL CODE. Mom is POA.   TOTAL TIME SPENT ON ADMISSION: 50 minutes.    ____________________________ Ramonita Lab, MD ag:gb D: 08/20/2012 01:13:53 ET T: 08/20/2012 02:14:00 ET JOB#: [id number]  cc: Ramonita Lab, MD, <Dictator> Ramonita Lab MD ELECTRONICALLY SIGNED 08/30/2012 7:45

## 2014-06-12 NOTE — Consult Note (Signed)
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 EKG revealed no acute changes from previous tracing done at [facility] in 5/14. Initial serum troponin was normal at less than 0.02. She complained of vaginal bleeding. She  has been on Xarelto for her pulmonary emboli.  Her hgb recently at [facility] lab was 8.1-8.8. She continues to abuse tobacco. She reports compliance with her medications.   Physical Exam:  GEN disheveled   HEENT PERRL, hearing intact to voice   NECK supple   RESP no use of accessory muscles  rhonchi   CARD Regular rate and rhythm  Tachycardic  Murmur   Murmur Systolic   Systolic Murmur Out flow  axilla   ABD positive Flank Tenderness  no hernia  normal BS   LYMPH negative neck   EXTR negative cyanosis/clubbing, negative edema   SKIN normal to palpation   NEURO cranial nerves intact, motor/sensory function intact   PSYCH alert   Review of Systems:  Subjective/Chief Complaint chest and abdominal pain   General: Fatigue   Skin: No Complaints   ENT: No Complaints   Eyes: No Complaints   Neck: No Complaints   Respiratory: Short of breath   Cardiovascular: Chest pain or discomfort  Dyspnea   Gastrointestinal: flank pain   Genitourinary: No Complaints   Vascular: No Complaints   Musculoskeletal: No Complaints   Neurologic: No Complaints   Hematologic: No Complaints   Endocrine: No Complaints   Psychiatric: No Complaints   Review of Systems: All other systems were reviewed and found to be negative   Medications/Allergies Reviewed  Medications/Allergies reviewed        Admit Diagnosis:   NEAR SYNCOPE ANEMIA CVA: Onset Date: 20-Aug-2012, Status: Active, Description: NEAR SYNCOPE ANEMIA CVA      Admit Reason:   Cerebrovascular accident (434.91): Onset Date: 20-Aug-2012, Status: Active, Coding System: ICD9, Coded Name: Unspecified cerebral artery occlusion with cerebral infarction  Home Medications: Medication Instructions Status  citalopram 40 mg oral tablet 1 tab(s) orally once a day Active  furosemide 20 mg oral tablet 1 tab(s) orally every other day Active  Xarelto 20 mg oral tablet 1 tab(s) orally once a day (in the morning) Active  carvedilol 25 mg oral tablet 1 tab(s) orally 2 times a day for CHF Active  spironolactone 25 mg oral tablet 0.5 tab(s) (12.5 mg) orally once a day Active   EKG:  Interpretation t wave inversion in lateral leads with chronic st elevation in inferior leads.    No Known Allergies:    Impression 24 yo female with history of congenital heart disease with history of dextrocardia, transposition of great vessels s/p Blalock procedure, GLenn procedure and Fontan procedure admitted with chest pain. She has a single ventricle. She is asplenic. Pt has history of pulmonary emboli and is on Xarelto for this. She has been followed at [facility] in the past and apparently recently at [facility]. Pt is a very poor historian regarding her past history and data obtained from [facility] charts. Records from [facility] currently not available. Pt was admitted with chest pain. Chest ct was inconclusive for pulmonary emboli due  to alterred flow patterns due to surgical conduits. Pt [facility] and [facility]. [facility]. Pt was noted to be anemic on admission with menorhagia. Hemoglobin was 7.4 on admission. Review of HGB done at [facility] up to 07/07/12 was 8.1-8.8. She has ruled out for mi thus far based on serum troponin of 0.02.   Plan 1. Continue with Xarelto at 20 mg daily due  to pulmonary emboi in the past and risk for further events. 2. Conitnue carvedilol and spironolactone 3. Smoking cessation discussed. 4. Consider MRA to evaluate further for pulmonary emboi 5. [facility] when bed available.   Electronic Signatures: Dalia HeadingFath, Rosali Augello A (MD)  (Signed 01-Jul-14 07:02)  Authored: General Aspect/Present Illness, History and Physical Exam, Review of System, Health Issues, Home Medications, EKG , Allergies, Impression/Plan   Last Updated: 01-Jul-14 07:02 by Dalia HeadingFath, Aireona Torelli A (MD)

## 2014-06-12 NOTE — Consult Note (Signed)
Chief Complaint:  Subjective/Chief Complaint Pt notes lower abdominal cramps 5/10.  Discussed CT findings with patient.  Denies N/V or rectal bleeding.   VITAL SIGNS/ANCILLARY NOTES: **Vital Signs.:   02-Jul-14 07:39  Vital Signs Type Routine  Temperature Temperature (F) 98.6  Celsius 37  Temperature Source oral  Pulse Pulse 62  Respirations Respirations 18  Systolic BP Systolic BP 100  Diastolic BP (mmHg) Diastolic BP (mmHg) 61  Mean BP 74  Pulse Ox % Pulse Ox % 94  Pulse Ox Activity Level  At rest  Oxygen Delivery 4L   Brief Assessment:  GEN well developed, well nourished, no acute distress, A/Ox3   Cardiac Regular   Respiratory normal resp effort   Gastrointestinal details normal Soft  Nondistended  No masses palpable  Bowel sounds normal  No rebound tenderness  No gaurding  No rigidity  +TTP bilat lower quads   EXTR negative edema   Additional Physical Exam Skin: warm, dry, multiple hyperpigmented lesions, tattoos   Lab Results: Routine Chem:  01-Jul-14 04:07   Result Comment NRBC - SLIDE PREVIOUSLY REVIEWED BY PATHOLOGIST WBC - RESULT CORRECTED FOR NUCLEATED RBCS  Result(s) reported on 20 Aug 2012 at 05:51AM.  Lipase  33 (Result(s) reported on 20 Aug 2012 at 09:40AM.)  Cardiac:  01-Jul-14 04:07   CPK-MB, Serum 2.1 (Result(s) reported on 20 Aug 2012 at 05:04AM.)  Troponin I < 0.02 (0.00-0.05 0.05 ng/mL or less: NEGATIVE  Repeat testing in 3-6 hrs  if clinically indicated. >0.05 ng/mL: POTENTIAL  MYOCARDIAL INJURY. Repeat  testing in 3-6 hrs if  clinically indicated. NOTE: An increase or decrease  of 30% or more on serial  testing suggests a  clinically important change)    12:03   CK, Total 60  CPK-MB, Serum 1.3 (Result(s) reported on 20 Aug 2012 at 12:56PM.)  Troponin I < 0.02 (0.00-0.05 0.05 ng/mL or less: NEGATIVE  Repeat testing in 3-6 hrs  if clinically indicated. >0.05 ng/mL: POTENTIAL  MYOCARDIAL INJURY. Repeat  testing in 3-6 hrs if  clinically indicated. NOTE: An increase or decrease  of 30% or more on serial  testing suggests a  clinically important change)  Routine Hem:  01-Jul-14 04:07   WBC (CBC) 7.7  RBC (CBC)  3.73  Hemoglobin (CBC)  8.0  Hematocrit (CBC)  26.7  Platelet Count (CBC) 324  MCV  72  MCH  21.3  MCHC  29.8  RDW  25.3  Bands 1  Segmented Neutrophils 65  Lymphocytes 22  Monocytes 10  Eosinophil 2  NRBC 36  Diff Comment 1 ANISOCYTOSIS  Diff Comment 2 POIKILOCYTOSIS  Diff Comment 3 TARGET CELLS  Diff Comment 4 ELLIPTOCYTES  Diff Comment 5 POLYCHROMASIA  Diff Comment 6 MICROCYTES PRESENT  Diff Comment 9 PLTS VARIED IN SIZE  Result(s) reported on 20 Aug 2012 at 05:51AM.   Radiology Results: [place]:    01-Jul-14 17:57, US Pelvis - NON OB  US Pelvis - NON OB   REASON FOR EXAM:    Menorrhagia  COMMENTS:       PROCEDURE: [place]  - US PELVIS EXAM  - Aug 20 2012  5:57PM     RESULT: Comparison: None.    Technique: Multiple grayscale and color Doppler images were obtained of   the pelvis via transabdominal ultrasound. The patient refused endovaginal   ultrasound.    Findings:  The uterus measures 8.0 by a 0.2 x 3.8 cm. The endometrial stripe   measures approximately 10 mm in thickness.  The  right ovary measures 2.9 x 2.2 x 1.9 cm. The left ovary measures 4.0   x3.5 x 3.0 cm. Color Doppler flow is associated with the ovaries. The   spectral Doppler imaging was not performed. There is a moderate degree of   free fluid in the pelvis which extends into the abdomen.    IMPRESSION:   1. The endometrial stripe is within normal limits via transabdominal   ultrasound. The patient refused endovaginal ultrasound.  2. Moderate amount of ascites in the pelvis extending to the abdomen,   which is nonspecific.      Dictation Site: 8      Verified By: Lewie Chamber, M.D., MD  CT:    01-Jul-14 22:44, CT Abdomen and Pelvis With Contrast  CT Abdomen and Pelvis With Contrast   REASON FOR EXAM:     (1) evaluate liver, ascites, nausea, vomiting, HSM;   (2) anemia, N/V, pelvic asci  COMMENTS:       PROCEDURE: CT  - CT ABDOMEN / PELVIS  W  - Aug 20 2012 10:44PM     RESULT: Comparison:  None    Technique: Multiple axial images of the abdomen and pelvis were performed   from the lung bases to the pubic symphysis, without p.o. contrast and   with 100 mL of Isovue 300 intravenous contrast.    Findings:  Mild basilar opacities are likely secondary to atelectasis. There are   trace bilateral pleural effusions, right greater than left. There is     dextrocardia. A graft is seen from the IVC extending superiorly beyond   the field-of-view which was shown to extend to the pulmonary artery on   recent prior CT. There is hypodensity within the graft which could   related to mixing artifact versus at least partial thrombosis.    The liver is enlarged and demonstrates diffuse heterogeneous enhancement.   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 There are multiple small hyperenhancing nodules throughout   the liver. The largest measures 12 mm in diameter. The main portal vein   is patent. No definite evidence of hepatic venous thrombosis on the   delayed images.    There is a moderate amount of ascites. The gallbladder, adrenals, and   pancreas are unremarkable. The spleen is absent. The kidneys enhance   normally.  The colon is in the left hemiabdomen with small bowel in the right   hemiabdomen, consistent with rotation. No evidence of volvulus. There is   a small tubular structure in the left adnexa which measures approximately   2.5 x 1.2 cm. This could represent hydrosalpinx. Excreted contrast   material is seen within the bladder from recent prior CT. There is mild   thickening of the bladder wall which could be secondary to lack of   distention.    No aggressive lytic or sclerotic osseous lesions are identified.    IMPRESSION:   1. The  liver is diffusely enlarged and demonstrates heterogeneous   enhancement. This can be secondary to the nutmeg liver from hepatic   venous congestion. There is a moderate amount of ascites. No definite   evidence of hepatic venous thrombosis. Recommend comparison with outside     prior studies.   2. There is some low-attenuation within the graft that extends from the   IVC to the pulmonary arteries which could be related to mixing artifact.   However, at least partial thrombosis within the  graft cannot be excluded.   Further evaluation with cardiac MRI at a tertiary center maybe   beneficial.  3. There are multiple small hyperenhancing nodules in the liver. This are   nonspecific and differential would include FNH, adenomas, regenerating   nodules, and small hepatocellular carcinomas. Comparison with outside   prior studies and followup is recommended.  4. There is a small cystic structure in the left adnexa which could   represent a hydrosalpinx or pyosalpinx.  5. Mild bladder wall thickening is nonspecific. Correlate for cystitis.      Verified By: Lewie ChamberOBERT L. 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  Pt remains on Xarelto.  CT shows possible partial thrombosis within the IVC graft.  Further evaluation per cardiology's recommendations. Anemia: likely secondary to menorrhagia.  Per GYN.   Plan 1) Hep BsAg/Ab, HCVab, & total Hep A.  If not immune to hepatitis A/B, would vaccinate 2) AFP 3) INR AM 4) FU hemoccult 5) PPI daily 6) Agree with antibiotics for UTI 7) Management of ?partial thrombosis IVC graft per cardiology 8) Mag repletion per attending   Electronic Signatures: Joselyn ArrowJones, Dezyre Hoefer L (NP)  (Signed 02-Jul-14 09:23)  Authored:  Chief Complaint, VITAL SIGNS/ANCILLARY NOTES, Brief Assessment, Lab Results, Radiology Results, Assessment/Plan   Last Updated: 02-Jul-14 09:23 by Joselyn ArrowJones, Adilson Grafton L (NP)

## 2014-06-13 NOTE — H&P (Signed)
PATIENT NAME:  [name], [name] MR#:  [id number] DATE OF BIRTH:  06/03/1990  DATE OF ADMISSION:  10/22/2013   REFERRING PHYSICIAN: Dr. Derrill Kay   PRIMARY DOCTOR: Su Ley, MD  ADMIT DIAGNOSIS: Seizure activity.  HISTORY OF PRESENT ILLNESS: This is a 24 year old African American female who presents to the Emergency Department with seizure and slurred speech. The patient states this is her third seizure in 2 months. She does not report any seizures in her lifetime, prior to now. The patient describes the seizure activity as having a slight prodrome of feeling uneasy. The patient was standing when she quickly fell over, not due to cataplexy as she remembers falling and having a visual of the room. She states that she became very stiff. There was no shaking involved, but all 4 extremities were rigid. She knows that her eyes rolled into her head, and she could hear the voices of those around her who were concerned by her condition. She denies losing consciousness at any time during the seizure activity, nor did she lose continence of bowel or bladder. She states the seizure lasted approximately 2 minutes, and she was very sleepy afterward. She slept for more a little more than an hour, after which she awoke, but still had some slurred speech, which prompted her visit to the hospital. Upon arrival, the patient did not have any further seizure-like activity, but was hypoxic on room air.   Notably, the patient has had transposition of her great vessels at the age of 1, due to dextrocardia. The Emergency Department staff called for admission for both the seizure activity and hypoxemia.   REVIEW OF SYSTEMS:  CONSTITUTIONAL: The patient denies fever, but feels subjectively weak.  EYES: The patient denies double vision or inflammation.  EARS, NOSE, AND THROAT: The patient denies tinnitus or sore throat.  RESPIRATORY: The patient denies cough, wheezing, or shortness of breath. CARDIOVASCULAR: The  patient denies chest pain, palpitations.  GASTROINTESTINAL: The patient denies nausea, vomiting, or diarrhea, but she admits to sharp abdominal pain across both  lower quadrants.  GENITOURINARY: The patient denies dysuria, increased hesitancy or frequency.  ENDOCRINE: The patient denies polyuria or nocturia.  INTEGUMENTARY: The patient denies rashes or lesions.  NEUROLOGIC: The patient denies dysarthria or weakness.  PSYCHIATRIC: The patient admits to depression, but denies suicidal ideation.   PAST MEDICAL HISTORY: Dextrocardia, seizure disorder and cerebrovascular accident 3 years ago.   PAST SURGICAL HISTORY: Transposition repair at the age of 1; also IVC filter placement 3 years ago.   FAMILY HISTORY: High blood pressure.   SOCIAL HISTORY: The patient denies tobacco use or alcohol use. She admits to history of snorting cocaine, but denies any intravenous drug use.   MEDICATIONS:  1.  Xarelto 20 mg 1 tablet p.o. daily.  2.  Spironolactone 25 mg 1/2 tablet p.o. daily.   3.  Furosemide 20 mg 1 tablet p.o. every other day.  4.  Citalopram 40 mg 1 tablet p.o. daily.  5.  Carvedilol 25 mg 1 tablet p.o. b.i.d.   ALLERGIES: PERCOCET.   PERTINENT LABORATORY RESULTS AND RADIOGRAPHIC FINDINGS: Chloride is 111. CO2 is 18. Lipase is 62. Bilirubin is 1.5. Alkaline phosphatase 133, AST 61 and ALT 21. Urine drug screen is positive for cannabinoids. Hemoglobin is 11.6, hematocrit is 40.5, MCV 71. HIV testing is negative. Urine shows sterile pyuria. Chest x-ray shows no evidence of aspiration or acute cardiopulmonary disease; it does show dextrocardia and evidence of surgical repair. CT angiography of the chest  is negative for PE or thoracic aortic dissection. There is a stable appearance of dextrocardia and there is some incidental abdominal ascites noted.   PHYSICAL EXAMINATION:  VITAL SIGNS: Temperature is 97.6, pulse 101, respirations 18, blood pressure 129/85, pulse oximetry 90% on 2 L of oxygen  by nasal cannula.  GENERAL: The patient is alert and oriented x 3, in no apparent distress.  HEENT: Normocephalic, atraumatic. Extraocular movements are intact. Pupils equal, round, and reactive to light and accommodation. Oropharynx is without exudate or erythema. Mucous membranes are somewhat dry.  NECK: Trachea is midline. No adenopathy.  CHEST: Symmetric. Atraumatic. There is a well-healed midline scar.  CARDIOVASCULAR: Regular rate and rhythm. Normal S1, S2. No rubs, clicks, or murmurs.  LUNGS: Clear to auscultation bilaterally. Normal effort and excursion.  ABDOMEN: Positive bowel sounds, soft, nontender, nondistended. No hepatosplenomegaly. No fluid wave detected.  GENITOURINARY: Deferred.  MUSCULOSKELETAL: The patient moves all 4 extremities equally.  SKIN: No rashes, but the patient has multiple scratch and pick lesions, as well as some lichenification of older lesions. There are multiple areas of what appear to be track marks on the dorsum of her hands and in her antecubital fossae.  EXTREMITIES: No clubbing, cyanosis, or edema.  NEUROLOGIC: Cranial nerves II through XII are grossly intact. Rapid alternating movements are normal. There is no pronator drift.  PSYCHIATRIC:  Mood is normal and affect is congruent.   ASSESSMENT AND PLAN: This is a 24 year old female who presents with seizure activity.  1.  Seizures. Origin is unknown. We will look for reversible causes of seizures. It appears to be a simple seizure, as there was no loss of consciousness. There was a vague awareness of her surroundings, and only tonic activity. These seizures have begun in adulthood. Etiology at this time it is unclear. I have ordered neurology consult.  2.  Cardiomyopathy. Continue Coreg and Lasix per home regimen, as well as spironolactone. Due to increased vascular resistance seondary to transposition, common to have mild hypoxemia due to mixing even after repair .   3.  Anticoagulation. We will continue  the patient on her Xarelto. She does not have atrial fibrillation, but apparently is on Xarelto following her deep vein thrombosis and pulmonary embolism, which led to stroke in the past. She also has her IVC filter.  4.  Gastrointestinal prophylaxis not necessary at this time, as patient is not critically ill.   The patient is a Full Code.   TIME SPENT ON ADMISSION ORDERS AND PATIENT CARE: Approximately 35 minutes.   ____________________________ Kelton PillarMichael S. Sheryle Hailiamond, MD msd:MT D: 10/22/2013 05:40:48 ET T: 10/22/2013 06:40:55 ET JOB#: [id number]  cc: Kelton PillarMichael S. Sheryle Hailiamond, MD, <Dictator> Kelton PillarMICHAEL S Fumiye Lubben MD ELECTRONICALLY SIGNED 10/22/2013 7:53

## 2014-06-13 NOTE — Consult Note (Signed)
PATIENT NAME:  [name], [name] MR#:  [id number] DATE OF BIRTH:  02/27/1990  DATE OF CONSULTATION:  10/22/2013   CONSULTING PHYSICIAN:  Pauletta BrownsYuriy Leslee Suire, MD  REASON FOR CONSULTATION:  Seizure activity.    HISTORY OF PRESENT ILLNESS:  A 24 year old female with past medical history of newly, what appears, diagnosed seizure that started about 2 months ago.  Since that time, the patient had about 3 seizures described as generalized tonic-clonic and recently on this presentation, tonic at which point the patient is extremely rigid and has positive postictal state.  No tongue biting or urinary incontinence.  No provoking symptoms. The patient states that she had an MRI 2 weeks prior at an unknown location, which we do not have results of.  The patient has history of transposition of great vessels since the age of 1 due to dextrocardia.  The patient was started on Keppra 500 mg twice a day.    REVIEW OF SYSTEMS:  Currently patient does complain of generalized fatigue. Denies any fevers. Denies any shortness of breath. Denies any abdominal pain. Denies any constipation, diarrhea. Denies any urinary incontinence. She denies any focal weakness on 1 side of the body compared to the other and positive history of depression and anxiety. No history of suicidal ideation.   PAST MEDICAL HISTORY: Dextrocardia, newly diagnosed seizure disorder.   PAST SURGICAL HISTORY: Transposition of vessels since the age of 1 and she has not received filter placed about 3 years ago for the suspected stroke.   HOME MEDICATIONS: Include Xarelto, spironolactone, furosemide, citalopram and Coreg.   ALLERGIES: INCLUDE PERCOCET.   LABORATORY DATA: Work-up has been reviewed. Negative. CT chest with PE protocol.   PHYSICAL EXAMINATION:  VITAL SIGNS: Include a temperature of 97.5, pulse 98, respirations 18, blood pressure 120/87.  PHYSICAL EXAMINATION: The patient is awake, alert and oriented.  Speech appears to be fluent.  Facial  sensation intact. Facial motor is intact. Tongue is midline. Uvula elevates symmetrically. Shoulder shrug intact. Motor strength: Generalized weakness with 4+/5 bilaterally.  Coordination: Finger-to-nose intact bilaterally.  Reflexes symmetrical. Gait could not be assessed.   IMPRESSION: A 24 year old female, new onset of seizures about 3 in the past 2 months. Patient states she smokes marijuana daily and history of inhaled cocaine use, but denies using it in the past month.   Would, described it as generalized tonic-clonic seizures, but on this presentation, the patient had tonic episode with postictal state. Currently Keppra 500 mg a day.   PLAN: Continue the same Keppra.  I am not sure she needs an EEG as she is back to her baseline.  I think she does need MRI of the brain.  She states she had 1 about 2 weeks ago but unknown results and she does not know the exact location.  MRI of the brain with and without contrast has been ordered.  Physical therapy, occupational therapy.  I suspect the patient will be able to be discharged after obtaining MRI and the patient is back to baseline.   Thank you, it was a pleasure seeing this patient.     ____________________________ Pauletta BrownsYuriy Airon Sahni, MD yz:DT D: 10/22/2013 14:02:24 ET T: 10/22/2013 15:54:43 ET JOB#: [id number]  cc: Pauletta BrownsYuriy Shant Hence, MD, <Dictator> Pauletta BrownsYURIY Zamyah Wiesman MD ELECTRONICALLY SIGNED 10/24/2013 16:08

## 2014-06-13 NOTE — Discharge Summary (Signed)
PATIENT NAME:  [name], [name] MR#:  [id number] DATE OF BIRTH:  Mar 18, 1990  DATE OF ADMISSION:  10/22/2013 DATE OF DISCHARGE:  10/23/2013  PRIMARY CARE PHYSICIAN: Su Leyobert L. Kinner, MD  CONSULTATIONS: Neurology, Pauletta BrownsYuriy Zeylikman, MD  DISCHARGE DIAGNOSES: Seizure, urinary tract infection, history of cerebrovascular accident, pulmonary embolism and deep vein thrombosis.   CONDITION: Stable.   CODE STATUS: Full code.   HOME MEDICATIONS: Please refer to the medication reconciliation list.   NEW MEDICATIONS: Keppra 500 mg p.o. b.i.d., Keflex 500 mg p.o. 4 times a day for 3 days for UTI.   DIET: Low sodium diet.   ACTIVITY: As tolerated.   FOLLOWUP CARE: With PCP within 1-2 weeks. [facility], bring MRI.  REASON FOR ADMISSION: Seizure activity.   HOSPITAL COURSE: The patient is a 24 year old [place] female with a history of seizure disorder who presented to the ED with seizure and slurred speech. The patient said that she had 3 times of seizure for the past 2 months. She does not report any seizure in her lifetime. For detailed history and physical examination, please refer to the admission note dictated by Dr. Sheryle Hailiamond. Laboratory data showed HIV test is negative. CAT scan of chest is negative for PE or aortic dissection. Urinalysis is showing UTI. Urine drug screen is positive for cannabinoids. Hemoglobin 11.6.   The patient was admitted for seizure activity. After admission, the patient has been treated with Keppra 500 mg p.o. b.i.d. with seizure precautions. Dr. Loretha BrasilZeylikman suggests MRI of brain and continue Keppra. The patient had no seizure activity after admission, and Dr. Loretha BrasilZeylikman saw the patient today, suggested the patient is stable to be discharged home today and follow up with his neurologist, brain MRI.   The patient has a history of CVA and PE, DVT. The patient is on Coumadin at home. We will continue Coumadin.   For UTI, the patient has  lower abdominal pain with nausea and vomiting. Symptoms have improved after treatment with Rocephin. We will continue Keflex for 3 days.   The patient's vital signs are stable. She is clinically stable, will be discharged to home today. I discussed the patient's discharge plan with the patient, nurse, case manager and Dr. Loretha BrasilZeylikman.   TIME SPENT: About 36 minutes.    ____________________________ Shaune PollackQing Brayah Urquilla, MD qc:TT D: 10/23/2013 14:53:52 ET T: 10/23/2013 20:46:10 ET JOB#: [id number]  cc: Shaune PollackQing Dartanion Teo, MD, <Dictator> Shaune PollackQING Elmo Shumard MD ELECTRONICALLY SIGNED 10/24/2013 14:36

## 2014-10-02 ENCOUNTER — Inpatient Hospital Stay (HOSPITAL_COMMUNITY)
Admission: EM | Admit: 2014-10-02 | Discharge: 2014-10-03 | DRG: 294 | Disposition: A | Discharge status: Other Acute Inpatient Hospital | Payer: Medicaid Other | Attending: Internal Medicine | Admitting: Internal Medicine

## 2014-10-02 ENCOUNTER — Encounter (HOSPITAL_COMMUNITY): Payer: Self-pay

## 2014-10-02 ENCOUNTER — Emergency Department (HOSPITAL_COMMUNITY): Payer: Medicaid Other

## 2014-10-02 DIAGNOSIS — I2699 Other pulmonary embolism without acute cor pulmonale: Secondary | ICD-10-CM | POA: Diagnosis present

## 2014-10-02 DIAGNOSIS — Q240 Dextrocardia: Secondary | ICD-10-CM

## 2014-10-02 DIAGNOSIS — I509 Heart failure, unspecified: Secondary | ICD-10-CM

## 2014-10-02 DIAGNOSIS — Z885 Allergy status to narcotic agent status: Secondary | ICD-10-CM

## 2014-10-02 DIAGNOSIS — Z3200 Encounter for pregnancy test, result unknown: Secondary | ICD-10-CM

## 2014-10-02 DIAGNOSIS — Z7901 Long term (current) use of anticoagulants: Secondary | ICD-10-CM

## 2014-10-02 DIAGNOSIS — N289 Disorder of kidney and ureter, unspecified: Secondary | ICD-10-CM | POA: Diagnosis present

## 2014-10-02 DIAGNOSIS — Z9889 Other specified postprocedural states: Secondary | ICD-10-CM

## 2014-10-02 DIAGNOSIS — R7989 Other specified abnormal findings of blood chemistry: Secondary | ICD-10-CM | POA: Diagnosis present

## 2014-10-02 DIAGNOSIS — E349 Endocrine disorder, unspecified: Secondary | ICD-10-CM | POA: Diagnosis present

## 2014-10-02 DIAGNOSIS — Z8673 Personal history of transient ischemic attack (TIA), and cerebral infarction without residual deficits: Secondary | ICD-10-CM

## 2014-10-02 DIAGNOSIS — Z79899 Other long term (current) drug therapy: Secondary | ICD-10-CM

## 2014-10-02 DIAGNOSIS — I429 Cardiomyopathy, unspecified: Secondary | ICD-10-CM | POA: Diagnosis present

## 2014-10-02 DIAGNOSIS — Z7982 Long term (current) use of aspirin: Secondary | ICD-10-CM

## 2014-10-02 DIAGNOSIS — F17210 Nicotine dependence, cigarettes, uncomplicated: Secondary | ICD-10-CM | POA: Diagnosis present

## 2014-10-02 DIAGNOSIS — I82220 Acute embolism and thrombosis of inferior vena cava: Principal | ICD-10-CM | POA: Diagnosis present

## 2014-10-02 HISTORY — DX: Disorder of kidney and ureter, unspecified: N28.9

## 2014-10-02 HISTORY — DX: Heart failure, unspecified: I50.9

## 2014-10-02 HISTORY — DX: Methicillin resistant Staphylococcus aureus infection, unspecified site: A49.02

## 2014-10-02 HISTORY — DX: Cerebral infarction, unspecified: I63.9

## 2014-10-02 HISTORY — DX: Dextrocardia: Q24.0

## 2014-10-02 LAB — BASIC METABOLIC PANEL
ANION GAP: 9 (ref 5–15)
BUN: 11 mg/dL (ref 6–20)
CALCIUM: 8.6 mg/dL — AB (ref 8.9–10.3)
CO2: 20 mmol/L — ABNORMAL LOW (ref 22–32)
Chloride: 105 mmol/L (ref 101–111)
Creatinine, Ser: 0.64 mg/dL (ref 0.44–1.00)
Glucose, Bld: 77 mg/dL (ref 65–99)
POTASSIUM: 4.2 mmol/L (ref 3.5–5.1)
SODIUM: 134 mmol/L — AB (ref 135–145)

## 2014-10-02 LAB — BRAIN NATRIURETIC PEPTIDE: B Natriuretic Peptide: 49.7 pg/mL (ref 0.0–100.0)

## 2014-10-02 LAB — CBC
HEMATOCRIT: 35.1 % — AB (ref 36.0–46.0)
Hemoglobin: 11.2 g/dL — ABNORMAL LOW (ref 12.0–15.0)
MCH: 23.3 pg — ABNORMAL LOW (ref 26.0–34.0)
MCHC: 31.9 g/dL (ref 30.0–36.0)
MCV: 73 fL — ABNORMAL LOW (ref 78.0–100.0)
Platelets: 166 10*3/uL (ref 150–400)
RBC: 4.81 MIL/uL (ref 3.87–5.11)
RDW: 20.6 % — ABNORMAL HIGH (ref 11.5–15.5)
WBC: 6.1 10*3/uL (ref 4.0–10.5)

## 2014-10-02 LAB — I-STAT BETA HCG BLOOD, ED (MC, WL, AP ONLY): I-stat hCG, quantitative: 9.8 m[IU]/mL — ABNORMAL HIGH (ref <5)

## 2014-10-02 LAB — I-STAT TROPONIN, ED: Troponin i, poc: 0 ng/mL (ref 0.00–0.08)

## 2014-10-02 NOTE — ED Notes (Signed)
Attempted IV stick X 2  

## 2014-10-02 NOTE — ED Notes (Signed)
Pt has cardiac hx of dextrocardia,  c/o substernal CP, no radiating, comes in EMS, had 6 hr bus ride from [place]. Some nausea and upper abd pain, which is resolved. No SOB. Pain 7/10 PTA received one nitro, and 325 ASA,  which helped pain

## 2014-10-02 NOTE — ED Provider Notes (Signed)
CSN: [id number]     Arrival date & time 10/02/14  2055 History   First MD Initiated Contact with Patient 10/02/14 2056     Chief Complaint  Patient presents with  . Chest Pain     (Consider location/radiation/quality/duration/timing/severity/associated sxs/prior Treatment) HPI Comments: Patient is a 24 year old female with a past medical history of dextrocardia, VSD, ASD, CHF, previous PE, previous stroke and IVC filter in place who presents with chest pain that started 2 hours ago. The pain is located in her right chest and does not radiate. The pain is sharp and worse with inspiration. Patient reports associated SOB. Patient's symptoms started when she was on a bus ride from [place], [place] to [place], [place]. No alleviating factors. Patient reports the last time she felt like this, she was admitted to the hospital for CHF. No other associated symptoms.    Past Medical History  Diagnosis Date  . Dextrocardia   . CHF (congestive heart failure)   . Stroke   . Renal disorder   . MRSA (methicillin resistant Staphylococcus aureus)    Past Surgical History  Procedure Laterality Date  . Appendectomy    . Cyst excision     No family history on file. Social History  Substance Use Topics  . Smoking status: Light Tobacco Smoker  . Smokeless tobacco: Never Used  . Alcohol Use: Yes   OB History    No data available     Review of Systems  Constitutional: Negative for fever, chills and fatigue.  HENT: Negative for trouble swallowing.   Eyes: Negative for visual disturbance.  Respiratory: Negative for shortness of breath.   Cardiovascular: Positive for chest pain. Negative for palpitations.  Gastrointestinal: Positive for nausea. Negative for vomiting, abdominal pain and diarrhea.  Genitourinary: Negative for dysuria and difficulty urinating.  Musculoskeletal: Negative for arthralgias and neck pain.  Skin: Negative for color change.  Neurological: Negative for dizziness and weakness.   Psychiatric/Behavioral: Negative for dysphoric mood.      Allergies  Percocet  Home Medications   Prior to Admission medications   Not on File   BP 128/82 mmHg  Pulse 85  Temp(Src) 98.2 F (36.8 C) (Oral)  Resp 18  Ht  (1.6 m)  Wt 138 lb (62.596 kg)  BMI 24.45 kg/m2  SpO2 93%  LMP 10/02/2014 Physical Exam  Constitutional: She is oriented to person, place, and time. She appears well-developed and well-nourished. No distress.  HENT:  Head: Normocephalic and atraumatic.  Eyes: Conjunctivae and EOM are normal.  Neck: Normal range of motion.  Cardiovascular: Normal rate, regular rhythm and intact distal pulses.  Exam reveals no gallop and no friction rub.   No murmur heard. 1+ bilateral lower extremity pitting edema. No calf tenderness to palpation.   Pulmonary/Chest: Effort normal and breath sounds normal. She has no wheezes. She has no rales. She exhibits no tenderness.  Abdominal: Soft. She exhibits no distension. There is no tenderness. There is no rebound.  Musculoskeletal: Normal range of motion.  Neurological: She is alert and oriented to person, place, and time. Coordination normal.  Speech is goal-oriented. Moves limbs without ataxia.   Skin: Skin is warm and dry.  Psychiatric: She has a normal mood and affect. Her behavior is normal.  Nursing note and vitals reviewed.   ED Course  Procedures (including critical care time)  CRITICAL CARE Performed by: Emilia Beck   Total critical care time: 35 min  Critical care time was exclusive of separately billable  procedures and treating other patients.  Critical care was necessary to treat or prevent imminent or life-threatening deterioration.  Critical care was time spent personally by me on the following activities: development of treatment plan with patient and/or surrogate as well as nursing, discussions with consultants, evaluation of patient's response to treatment, examination of patient, obtaining  history from patient or surrogate, ordering and performing treatments and interventions, ordering and review of laboratory studies, ordering and review of radiographic studies, pulse oximetry and re-evaluation of patient's condition.   Labs Review Labs Reviewed  BASIC METABOLIC PANEL - Abnormal; Notable for the following:    Sodium 134 (*)    CO2 20 (*)    Calcium 8.6 (*)    All other components within normal limits  CBC - Abnormal; Notable for the following:    Hemoglobin 11.2 (*)    HCT 35.1 (*)    MCV 73.0 (*)    MCH 23.3 (*)    RDW 20.6 (*)    All other components within normal limits  I-STAT BETA HCG BLOOD, ED ([facility], [facility], [facility] ONLY) - Abnormal; Notable for the following:    I-stat hCG, quantitative 9.8 (*)    All other components within normal limits  BRAIN NATRIURETIC PEPTIDE  I-STAT TROPOININ, ED  Rosezena Sensor, ED    Imaging Review Dg Chest 2 View  10/02/2014   CLINICAL DATA:  Chest pain. Mid sternal sharp chest pain. Abdominal pain. Dextro cardia.  EXAM: CHEST - 2 VIEW  COMPARISON:  None.  FINDINGS: The heart is in the right chest. Left-sided aortic arch is present. Embolization coils are present. Lungs are clear. An IVC filter is in place. Levoconvex curvature is present in the upper thoracic spine.  IMPRESSION: 1. Dextro cardia.  A left-sided aortic arch is present. 2. No acute cardiopulmonary disease. 3. Leftward curvature of the upper thoracic spine.   Electronically Signed   By: Marin Roberts M.D.   On: 10/02/2014 21:49   Ct Angio Chest Pe W/cm &/or Wo Cm  10/03/2014   CLINICAL DATA:  Chest pain. IVC filter. Shortness of breath. Dextro cardia.  EXAM: CT ANGIOGRAPHY CHEST WITH CONTRAST  TECHNIQUE: Multidetector CT imaging of the chest was performed using the standard protocol during bolus administration of intravenous contrast. Multiplanar CT image reconstructions and MIPs were obtained to evaluate the vascular anatomy.  CONTRAST:  80 mL Omnipaque 350  COMPARISON:   Two-view chest x-ray 10/02/2014.  FINDINGS: Dextro cardia is present. The posterior right ventricle supplies a left aortic arch. A ventricular septal defect is suggested. Atrial septal defect is also suggested.  Pulmonary arteries are in direct communication with the SVC. There is extensive thrombus within the IVC extending to the superior aspect of the heart.  A left lower lobe pulmonary embolus is noted. This is nonocclusive. No other focal emboli present on the left. A smaller nonocclusive embolus is present at the right lower lobe.  The thoracic inlet is within normal limits. The soft this is normal. The liver is markedly enlarged. Stomach is on the left.  A loculated pleural fluid is present in the right lower lobe. Mild dependent atelectasis is present bilaterally. A subdiaphragmatic fluid collection on the right measures 6.2 x 2.1 cm.  Bone windows demonstrate scoliosis with leftward curvature of the upper thoracic spine.  Review of the MIP images confirms the above findings.  IMPRESSION: 1. 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 2. Extensive thrombus within the IVC despite a lower IVC filter. 3. The pulmonary  arteries are fed directly from the SVC (fontan procedure). 4. Small nonocclusive emboli are present within the lower lobe pulmonary emboli and pulmonary arteries bilaterally. 5. Loculated right lower lobe pleural fluid. 6. 6.2 x 2.1 cm subdiaphragmatic fluid collection on the right. 7. 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 These results were called by telephone at the time of interpretation on 10/03/2014 at 1:15 am to Dr. Littie Deeds, who verbally acknowledged these results.   Electronically Signed   By: Marin Roberts M.D.   On: 10/03/2014 01:19   I, Emilia Beck, personally reviewed and evaluated these images and lab results as part of my medical decision-making.   EKG Interpretation   Date/Time:  Friday October 02 2014 21:01:05  EDT Ventricular Rate:  80 PR Interval:  156 QRS Duration: 118 QT Interval:  405 QTC Calculation: 467 R Axis:   -71 Text Interpretation:  Sinus rhythm Incomplete right bundle branch block  LVH with IVCD, LAD and secondary repol abnrm Borderline ST elevation st  changes improved since prior Confirmed by Mirian Mo [phone]) on  10/02/2014 11:42:18 PM      MDM   Final diagnoses:  PE (pulmonary embolism)  IVC thrombosis    9:16 PM Labs and chest xray pending. Vitals stable and patient afebrile.   Patient's CT angio shows extensive thrombus in the IVC and in bilateral lungs. Patient will be admitted.   [address] Pratt, PA-C 10/03/14 1914  Mirian Mo, MD 10/07/14 [phone]

## 2014-10-02 NOTE — ED Notes (Signed)
Patient transported to X-ray 

## 2014-10-03 ENCOUNTER — Encounter (HOSPITAL_COMMUNITY): Payer: Self-pay | Admitting: Radiology

## 2014-10-03 ENCOUNTER — Inpatient Hospital Stay (HOSPITAL_COMMUNITY): Payer: Medicaid Other

## 2014-10-03 ENCOUNTER — Emergency Department (HOSPITAL_COMMUNITY): Payer: Medicaid Other

## 2014-10-03 DIAGNOSIS — Z885 Allergy status to narcotic agent status: Secondary | ICD-10-CM | POA: Diagnosis not present

## 2014-10-03 DIAGNOSIS — Z7901 Long term (current) use of anticoagulants: Secondary | ICD-10-CM | POA: Diagnosis not present

## 2014-10-03 DIAGNOSIS — F17210 Nicotine dependence, cigarettes, uncomplicated: Secondary | ICD-10-CM | POA: Diagnosis present

## 2014-10-03 DIAGNOSIS — I5030 Unspecified diastolic (congestive) heart failure: Secondary | ICD-10-CM

## 2014-10-03 DIAGNOSIS — Z79899 Other long term (current) drug therapy: Secondary | ICD-10-CM | POA: Diagnosis not present

## 2014-10-03 DIAGNOSIS — I82220 Acute embolism and thrombosis of inferior vena cava: Secondary | ICD-10-CM | POA: Diagnosis present

## 2014-10-03 DIAGNOSIS — Q240 Dextrocardia: Secondary | ICD-10-CM | POA: Diagnosis not present

## 2014-10-03 DIAGNOSIS — Z7982 Long term (current) use of aspirin: Secondary | ICD-10-CM | POA: Diagnosis not present

## 2014-10-03 DIAGNOSIS — Z8673 Personal history of transient ischemic attack (TIA), and cerebral infarction without residual deficits: Secondary | ICD-10-CM | POA: Diagnosis not present

## 2014-10-03 DIAGNOSIS — Z9889 Other specified postprocedural states: Secondary | ICD-10-CM | POA: Diagnosis not present

## 2014-10-03 DIAGNOSIS — I2699 Other pulmonary embolism without acute cor pulmonale: Secondary | ICD-10-CM | POA: Diagnosis present

## 2014-10-03 DIAGNOSIS — I509 Heart failure, unspecified: Secondary | ICD-10-CM

## 2014-10-03 DIAGNOSIS — E349 Endocrine disorder, unspecified: Secondary | ICD-10-CM

## 2014-10-03 DIAGNOSIS — R7989 Other specified abnormal findings of blood chemistry: Secondary | ICD-10-CM | POA: Diagnosis present

## 2014-10-03 DIAGNOSIS — N289 Disorder of kidney and ureter, unspecified: Secondary | ICD-10-CM | POA: Diagnosis present

## 2014-10-03 DIAGNOSIS — I429 Cardiomyopathy, unspecified: Secondary | ICD-10-CM | POA: Diagnosis present

## 2014-10-03 LAB — CBC
HCT: 33.8 % — ABNORMAL LOW (ref 36.0–46.0)
Hemoglobin: 11 g/dL — ABNORMAL LOW (ref 12.0–15.0)
MCH: 23 pg — AB (ref 26.0–34.0)
MCHC: 32.5 g/dL (ref 30.0–36.0)
MCV: 70.7 fL — AB (ref 78.0–100.0)
PLATELETS: 261 10*3/uL (ref 150–400)
RBC: 4.78 MIL/uL (ref 3.87–5.11)
RDW: 20.5 % — AB (ref 11.5–15.5)
WBC: 6.6 10*3/uL (ref 4.0–10.5)

## 2014-10-03 LAB — BASIC METABOLIC PANEL
Anion gap: 5 (ref 5–15)
BUN: 10 mg/dL (ref 6–20)
CALCIUM: 8.7 mg/dL — AB (ref 8.9–10.3)
CO2: 23 mmol/L (ref 22–32)
CREATININE: 0.7 mg/dL (ref 0.44–1.00)
Chloride: 104 mmol/L (ref 101–111)
GFR calc Af Amer: >60 mL/min (ref >60)
GFR calc non Af Amer: >60 mL/min (ref >60)
GLUCOSE: 94 mg/dL (ref 65–99)
Potassium: 3.6 mmol/L (ref 3.5–5.1)
Sodium: 132 mmol/L — ABNORMAL LOW (ref 135–145)

## 2014-10-03 LAB — MRSA PCR SCREENING: MRSA by PCR: NEGATIVE

## 2014-10-03 LAB — APTT: APTT: 31 s (ref 24–37)

## 2014-10-03 LAB — I-STAT TROPONIN, ED: TROPONIN I, POC: 0 ng/mL (ref 0.00–0.08)

## 2014-10-03 LAB — HEPARIN LEVEL (UNFRACTIONATED)

## 2014-10-03 MED ORDER — HYDROMORPHONE HCL 1 MG/ML IJ SOLN: 0.5000 mg | INTRAMUSCULAR | Status: DC | PRN

## 2014-10-03 MED ORDER — METOPROLOL TARTRATE 25 MG PO TABS
25.0000 mg | ORAL_TABLET | Freq: Two times a day (BID) | ORAL | Status: DC
  Administered 2014-10-03: 25 mg via ORAL
  Filled 2014-10-03 (×2): qty 1

## 2014-10-03 MED ORDER — SODIUM CHLORIDE 0.9 % IJ SOLN: 3.0000 mL | INTRAMUSCULAR | Status: DC | PRN

## 2014-10-03 MED ORDER — SODIUM CHLORIDE 0.9 % IV SOLN: 250.0000 mL | INTRAVENOUS | Status: DC | PRN

## 2014-10-03 MED ORDER — ONDANSETRON HCL 4 MG/2ML IJ SOLN: 4.0000 mg | Freq: Four times a day (QID) | INTRAMUSCULAR | Status: DC | PRN

## 2014-10-03 MED ORDER — SPIRONOLACTONE 25 MG PO TABS
25.0000 mg | ORAL_TABLET | Freq: Every day | ORAL | Status: DC
  Filled 2014-10-03: qty 1

## 2014-10-03 MED ORDER — HEPARIN (PORCINE) IN NACL 100-0.45 UNIT/ML-% IJ SOLN
1100.0000 [IU]/h | INTRAMUSCULAR | Status: DC
  Administered 2014-10-03: 1100 [IU]/h via INTRAVENOUS
  Filled 2014-10-03 (×2): qty 250

## 2014-10-03 MED ORDER — LEVETIRACETAM 500 MG PO TABS
500.0000 mg | ORAL_TABLET | Freq: Every day | ORAL | Status: DC
  Filled 2014-10-03: qty 1

## 2014-10-03 MED ORDER — ENOXAPARIN SODIUM 80 MG/0.8ML ~~LOC~~ SOLN
1.0000 mg/kg | Freq: Two times a day (BID) | SUBCUTANEOUS | Status: DC
  Administered 2014-10-03: 65 mg via SUBCUTANEOUS
  Filled 2014-10-03 (×3): qty 0.8

## 2014-10-03 MED ORDER — ACETAMINOPHEN 325 MG PO TABS: 650.0000 mg | ORAL_TABLET | Freq: Four times a day (QID) | ORAL | Status: DC | PRN

## 2014-10-03 MED ORDER — HEPARIN BOLUS VIA INFUSION
2000.0000 [IU] | Freq: Once | INTRAVENOUS | Status: AC
Start: 2014-10-03 — End: 2014-10-03
  Administered 2014-10-03: 2000 [IU] via INTRAVENOUS
  Filled 2014-10-03: qty 2000

## 2014-10-03 MED ORDER — SODIUM CHLORIDE 0.9 % IJ SOLN: 3.0000 mL | Freq: Two times a day (BID) | INTRAMUSCULAR | Status: DC

## 2014-10-03 MED ORDER — IOHEXOL 350 MG/ML SOLN
80.0000 mL | Freq: Once | INTRAVENOUS | Status: AC | PRN
  Administered 2014-10-03: 80 mL via INTRAVENOUS

## 2014-10-03 MED ORDER — FUROSEMIDE 20 MG PO TABS
20.0000 mg | ORAL_TABLET | Freq: Every day | ORAL | Status: DC
  Administered 2014-10-03: 20 mg via ORAL
  Filled 2014-10-03: qty 1

## 2014-10-03 MED ORDER — ASPIRIN 81 MG PO CHEW
81.0000 mg | CHEWABLE_TABLET | Freq: Every day | ORAL | Status: DC
  Administered 2014-10-03: 81 mg via ORAL

## 2014-10-03 MED ORDER — ALUM & MAG HYDROXIDE-SIMETH 200-200-20 MG/5ML PO SUSP: 30.0000 mL | Freq: Four times a day (QID) | ORAL | Status: DC | PRN

## 2014-10-03 MED ORDER — ACETAMINOPHEN 650 MG RE SUPP: 650.0000 mg | Freq: Four times a day (QID) | RECTAL | Status: DC | PRN

## 2014-10-03 MED ORDER — LEVETIRACETAM 500 MG PO TABS
500.0000 mg | ORAL_TABLET | Freq: Two times a day (BID) | ORAL | Status: DC
  Administered 2014-10-03: 500 mg via ORAL
  Filled 2014-10-03 (×2): qty 1

## 2014-10-03 MED ORDER — CITALOPRAM HYDROBROMIDE 20 MG PO TABS
20.0000 mg | ORAL_TABLET | Freq: Every day | ORAL | Status: DC
  Filled 2014-10-03: qty 1

## 2014-10-03 MED ORDER — ONDANSETRON HCL 4 MG PO TABS: 4.0000 mg | ORAL_TABLET | Freq: Four times a day (QID) | ORAL | Status: DC | PRN

## 2014-10-03 MED ORDER — ENOXAPARIN SODIUM 150 MG/ML ~~LOC~~ SOLN: 1.0000 mg/kg | Freq: Two times a day (BID) | SUBCUTANEOUS | Status: AC

## 2014-10-03 NOTE — Progress Notes (Signed)
UR Completed. Ramandeep Arington, RN, BSN.  [phone] 

## 2014-10-03 NOTE — H&P (Signed)
[facility] Admission History and Physical       [name] ZOX:[id number] DOB: [birth year] DOA: 10/02/2014  Referring physician:  PCP: Adella Hare, MD  Specialists:   Chief Complaint: Chest Pain and SOB and Lower ABD Pain  HPI: [name] is a 24 y.o. female with a history of Dextrocardia, CHF, Pulmonary Emboli S/P IVC Filter on Eliquis Rx who presents to the ED with complaints of chest pain and SOB that started a few hours prior to coming to the ED.  Her pain was sharp and worse with deep breaths.  She also reports having ABD Pain for the past 2-3 days.   She reports that she has missed several doses of her Eliquis Rx.  And she was evaluated in the ED and was found to have Thrombus in the Inferior Vena Cava and Multiple small Pulmonary Emboli as well as a RLL Loculated Pleural effusion.   She was started on and IV Heparin drip and referred for admission.  A Serum HCG was performed and was found to be elevated.      Review of Systems:  Constitutional: No Weight Loss, No Weight Gain, Night Sweats, Fevers, Chills, Dizziness, Light Headedness, Fatigue, or Generalized Weakness HEENT: No Headaches, Difficulty Swallowing,Tooth/Dental Problems,Sore Throat,  No Sneezing, Rhinitis, Ear Ache, Nasal Congestion, or Post Nasal Drip,  Cardio-vascular:  +Chest pain, Orthopnea, PND, Edema in Lower Extremities, Anasarca, Dizziness, Palpitations  Resp: +Dyspnea, No DOE, No Productive Cough, No Non-Productive Cough, No Hemoptysis, No Wheezing.    GI: No Heartburn, Indigestion, +Abdominal Pain, Nausea, Vomiting, Diarrhea, Constipation, Hematemesis, Hematochezia, Melena, Change in Bowel Habits,  Loss of Appetite  GU: No Dysuria, No Change in Color of Urine, No Urgency or Urinary Frequency, No Flank pain.  Musculoskeletal: No Joint Pain or Swelling, No Decreased Range of Motion, No Back Pain.  Neurologic: No Syncope, No Seizures, Muscle Weakness, Paresthesia, Vision  Disturbance or Loss, No Diplopia, No Vertigo, No Difficulty Walking,  Skin: No Rash or Lesions. Psych: No Change in Mood or Affect, No Depression or Anxiety, No Memory loss, No Confusion, or Hallucinations  Past Medical History  Diagnosis Date  . Dextrocardia   . CHF (congestive heart failure)   . Stroke   . Renal disorder   . MRSA (methicillin resistant Staphylococcus aureus)      Past Surgical History  Procedure Laterality Date  . Appendectomy    . Cyst excision        IVC Filter   Prior to Admission medications   Medication Sig Start Date End Date Taking? Authorizing Provider  apixaban (ELIQUIS) 5 MG TABS tablet Take 5 mg by mouth daily. 08/12/14  Yes Historical Provider, MD  aspirin 81 MG chewable tablet Chew 81 mg by mouth daily. 12/11/13  Yes Historical Provider, MD  citalopram (CELEXA) 20 MG tablet Take 20 mg by mouth daily. 12/23/13  Yes Historical Provider, MD  furosemide (LASIX) 20 MG tablet Take 20 mg by mouth daily. 12/23/13  Yes Historical Provider, MD  levETIRAcetam (KEPPRA) 500 MG tablet Take 500 mg by mouth daily. 12/23/13  Yes Historical Provider, MD  lisinopril (PRINIVIL,ZESTRIL) 2.5 MG tablet Take 2.5 mg by mouth daily. 12/23/13  Yes Historical Provider, MD  metoprolol tartrate (LOPRESSOR) 25 MG tablet Take 25 mg by mouth 2 (two) times daily. 12/23/13  Yes Historical Provider, MD  spironolactone (ALDACTONE) 25 MG tablet Take 25 mg by mouth daily. 12/23/13  Yes Historical Provider, MD  warfarin (COUMADIN) 4 MG tablet Take 8 mg  by mouth daily. 12/23/13  Yes Historical Provider, MD     Allergies  Allergen Reactions  . Percocet [Oxycodone-Acetaminophen] Hives    Social History:  reports that she has been smoking.  She has never used smokeless tobacco. She reports that she drinks alcohol. Her drug history is not on file.     No family history on file.     Physical Exam:  GEN:  Pleasant Well Nourished and Well Developed  24 y.o. African American female examined  and in no acute distress; cooperative with exam Filed Vitals:   10/03/14 0100 10/03/14 0115 10/03/14 0130 10/03/14 0300  BP: 144/72 132/76 134/71 148/99  Pulse: 59 81 86 78  Temp:    98 F (36.7 C)  TempSrc:    Oral  Resp:    18  Height:    5\' 3"  (1.6 m)  Weight:    64.003 kg (141 lb 1.6 oz)  SpO2: 97% 95% 100% 100%   Blood pressure 148/99, pulse 78, temperature 98 F (36.7 C), temperature source Oral, resp. rate 18, height 5\' 3"  (1.6 m), weight 64.003 kg (141 lb 1.6 oz), SpO2 100 %. PSYCH: She is alert and oriented x4; does not appear anxious does not appear depressed; affect is normal HEENT: Normocephalic and Atraumatic, Mucous membranes pink; PERRLA; EOM intact; Fundi:  Benign;  No scleral icterus, Nares: Patent, Oropharynx: Clear, Fair Dentition,    Neck:  FROM, No Cervical Lymphadenopathy nor Thyromegaly or Carotid Bruit; No JVD; Breasts:: Not examined CHEST WALL: No tenderness CHEST: Normal respiration, clear to auscultation bilaterally HEART: Regular rate and rhythm; no murmurs rubs or gallops BACK: No kyphosis or scoliosis; No CVA tenderness ABDOMEN: Positive Bowel Sounds, Soft Non-Tender, No Rebound or Guarding; No Masses, No Organomegaly. Rectal Exam: Not done EXTREMITIES: No Cyanosis, Clubbing, or Edema; No Ulcerations. Genitalia: not examined PULSES: 2+ and symmetric SKIN: Normal hydration no rash or ulceration CNS:  Alert and Oriented x 4, No Focal Deficits Vascular: pulses palpable throughout    Labs on Admission:  Basic Metabolic Panel:  Recent Labs Lab 10/02/14 2122  NA 134*  K 4.2  CL 105  CO2 20*  GLUCOSE 77  BUN 11  CREATININE 0.64  CALCIUM 8.6*   Liver Function Tests: No results for input(s): AST, ALT, ALKPHOS, BILITOT, PROT, ALBUMIN in the last 168 hours. No results for input(s): LIPASE, AMYLASE in the last 168 hours. No results for input(s): AMMONIA in the last 168 hours. CBC:  Recent Labs Lab 10/02/14 2122  WBC 6.1  HGB 11.2*  HCT  35.1*  MCV 73.0*  PLT 166   Cardiac Enzymes: No results for input(s): CKTOTAL, CKMB, CKMBINDEX, TROPONINI in the last 168 hours.  BNP (last 3 results)  Recent Labs  10/02/14 2122  BNP 49.7    ProBNP (last 3 results) No results for input(s): PROBNP in the last 8760 hours.  CBG: No results for input(s): GLUCAP in the last 168 hours.  Radiological Exams on Admission: Dg Chest 2 View  10/02/2014   CLINICAL DATA:  Chest pain. Mid sternal sharp chest pain. Abdominal pain. Dextro cardia.  EXAM: CHEST - 2 VIEW  COMPARISON:  None.  FINDINGS: The heart is in the right chest. Left-sided aortic arch is present. Embolization coils are present. Lungs are clear. An IVC filter is in place. Levoconvex curvature is present in the upper thoracic spine.  IMPRESSION: 1. Dextro cardia.  A left-sided aortic arch is present. 2. No acute cardiopulmonary disease. 3. Leftward curvature of the  upper thoracic spine.   Electronically Signed   By: Marin Roberts M.D.   On: 10/02/2014 21:49   Ct Angio Chest Pe W/cm &/or Wo Cm  10/03/2014   CLINICAL DATA:  Chest pain. IVC filter. Shortness of breath. Dextro cardia.  EXAM: CT ANGIOGRAPHY CHEST WITH CONTRAST  TECHNIQUE: Multidetector CT imaging of the chest was performed using the standard protocol during bolus administration of intravenous contrast. Multiplanar CT image reconstructions and MIPs were obtained to evaluate the vascular anatomy.  CONTRAST:  80 mL Omnipaque 350  COMPARISON:  Two-view chest x-ray 10/02/2014.  FINDINGS: Dextro cardia is present. The posterior right ventricle supplies a left aortic arch. A ventricular septal defect is suggested. Atrial septal defect is also suggested.  Pulmonary arteries are in direct communication with the SVC. There is extensive thrombus within the IVC extending to the superior aspect of the heart.  A left lower lobe pulmonary embolus is noted. This is nonocclusive. No other focal emboli present on the left. A smaller  nonocclusive embolus is present at the right lower lobe.  The thoracic inlet is within normal limits. The soft this is normal. The liver is markedly enlarged. Stomach is on the left.  A loculated pleural fluid is present in the right lower lobe. Mild dependent atelectasis is present bilaterally. A subdiaphragmatic fluid collection on the right measures 6.2 x 2.1 cm.  Bone windows demonstrate scoliosis with leftward curvature of the upper thoracic spine.  Review of the MIP images confirms the above findings.  IMPRESSION: 1. 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 2. Extensive thrombus within the IVC despite a lower IVC filter. 3. The pulmonary arteries are fed directly from the SVC (fontan procedure). 4. Small nonocclusive emboli are present within the lower lobe pulmonary emboli and pulmonary arteries bilaterally. 5. Loculated right lower lobe pleural fluid. 6. 6.2 x 2.1 cm subdiaphragmatic fluid collection on the right. 7. 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 These results were called by telephone at the time of interpretation on 10/03/2014 at 1:15 am to Dr. Littie Deeds, who verbally acknowledged these results.   Electronically Signed   By: Marin Roberts M.D.   On: 10/03/2014 01:19     EKG: Independently reviewed.    Assessment/Plan:   24 y.o. female with  Principal Problem:   1.     IVC thrombosis   IV Heparin drip   Active Problems:   2.    Pulmonary emboli   IV Heparin drip     3.    CHF (congestive heart failure)     Monitor for Fluid Overload   Strict I/Os and Daily Weights   Continue Lasix and Metoprolol Rx    Discontinue Lisinopril Rx due to + HCG     4.    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    Elevated serum hCG   Repeat HCG level    Patient Reports that She is on Depo Provera     6.    DVT Prophylaxis   On IV Heparin           Code Status:     FULL CODE        Family  Communication:    No Family Present    Disposition Plan:    Inpatient Status        Time spent: 2 Minutes      Uma Jerde C [facility]  Pager [phone]   If 7AM -7PM Please Contact the Day Rounding Team MD for [facility]  If 7PM-7AM, Please Contact Night-Floor Coverage  [email or web address] Password [email or web address] 10/03/2014, 4:02 AM     ADDENDUM:   Patient was seen and examined on 10/03/2014

## 2014-10-03 NOTE — Progress Notes (Signed)
ANTICOAGULATION CONSULT NOTE - Initial Consult  Pharmacy Consult for enoxaparin Indication: pulmonary embolus  Allergies  Allergen Reactions  . Percocet [Oxycodone-Acetaminophen] Hives    Patient Measurements: Height:  (160 cm) Weight: 141 lb 1.6 oz (64.003 kg) IBW/kg (Calculated) : 52.4  Vital Signs: Temp: 98 F (36.7 C) (08/13 0300) Temp Source: Oral (08/13 0300) BP: 148/99 mmHg (08/13 0300) Pulse Rate: 78 (08/13 0300)  Labs:  Recent Labs  10/02/14 2122 10/03/14 0334 10/03/14 0335 10/03/14 0505  HGB 11.2*  --   --  11.0*  HCT 35.1*  --   --  33.8*  PLT 166  --   --  261  APTT  --   --  31  --   HEPARINUNFRC  --  <0.10*  --   --   CREATININE 0.64  --   --  0.70    Estimated Creatinine Clearance: 96.7 mL/min (by C-G formula based on Cr of 0.7).   Medical History: Past Medical History  Diagnosis Date  . Dextrocardia   . CHF (congestive heart failure)   . Stroke   . Renal disorder   . MRSA (methicillin resistant Staphylococcus aureus)     Medications:  Prescriptions prior to admission  Medication Sig Dispense Refill Last Dose  . apixaban (ELIQUIS) 5 MG TABS tablet Take 5 mg by mouth daily.   10/02/2014 at 0800  . aspirin 81 MG chewable tablet Chew 81 mg by mouth daily.   10/02/2014 at Unknown time  . citalopram (CELEXA) 20 MG tablet Take 20 mg by mouth daily.   10/02/2014 at Unknown time  . furosemide (LASIX) 20 MG tablet Take 20 mg by mouth daily.   10/02/2014 at Unknown time  . levETIRAcetam (KEPPRA) 500 MG tablet Take 500 mg by mouth daily.   10/02/2014 at Unknown time  . lisinopril (PRINIVIL,ZESTRIL) 2.5 MG tablet Take 2.5 mg by mouth daily.   10/02/2014 at Unknown time  . metoprolol tartrate (LOPRESSOR) 25 MG tablet Take 25 mg by mouth 2 (two) times daily.   10/02/2014 at 0800  . spironolactone (ALDACTONE) 25 MG tablet Take 25 mg by mouth daily.   10/02/2014 at Unknown time  . warfarin (COUMADIN) 4 MG tablet Take 8 mg by mouth daily.   10/02/2014 at 0800     Assessment: 24 YOF with PMH of dextrocardia, VSD, ASD, CHF, previous PE, previous stroke and IVC filter presented with chest pain that started 2 hours ago. CT angio is positive for new bilateral PE. Patient was on apixaban prior to admission and last dose was at 0800 yesterday. H/H low, Plt wnl.   Now switching to enoxaparin. Patient is pregnant? Elevated hcg   Plan:  -D/C heparin -Initiate enoxaparin 65 mg BID beginning 1 hour after stopping heparin -CBC every 72 hours -Monitor renal function and s/sx of bleeding  Isaac Bliss, PharmD, BCPS Clinical Pharmacist Pager [phone] 10/03/2014 8:46 AM

## 2014-10-03 NOTE — Progress Notes (Signed)
ANTICOAGULATION CONSULT NOTE - Initial Consult  Pharmacy Consult for Heparin  Indication: pulmonary embolus  Allergies  Allergen Reactions  . Percocet [Oxycodone-Acetaminophen] Hives    Patient Measurements: Height:  (160 cm) Weight: 138 lb (62.596 kg) IBW/kg (Calculated) : 52.4  Vital Signs: Temp: 98.2 F (36.8 C) (08/12 2105) Temp Source: Oral (08/12 2105) BP: 141/77 mmHg (08/13 0015) Pulse Rate: 66 (08/13 0015)  Labs:  Recent Labs  10/02/14 2122  HGB 11.2*  HCT 35.1*  PLT 166  CREATININE 0.64    Estimated Creatinine Clearance: 88.9 mL/min (by C-G formula based on Cr of 0.64).   Medical History: Past Medical History  Diagnosis Date  . Dextrocardia   . CHF (congestive heart failure)   . Stroke   . Renal disorder   . MRSA (methicillin resistant Staphylococcus aureus)     Medications:   (Not in a hospital admission)  Assessment: 65 YOF with PMH of dextrocardia, VSD, ASD, CHF, previous PE, previous stroke and IVC filter presented with chest pain that started 2 hours ago. CT angio is positive for new bilateral PE. Patient was on apixaban prior to admission and last dose was at 0800 yesterday. H/H low, Plt wnl.   Goal of Therapy:  Heparin level 0.3-0.7 units/ml  APTT 66-102 s Monitor platelets by anticoagulation protocol: Yes   Plan:  -Give half bolus of heparin 2000 units followed by heparin infusion at 1100 units/hr -F/u 6 hr HL/aPTT -Monitor daily HL, CBC and s/s of bleeding   Vinnie Level, PharmD., BCPS Clinical Pharmacist Pager [phone]

## 2014-10-03 NOTE — Discharge Summary (Signed)
Physician Discharge Summary  [name] VHQ:[id number] DOB: [birth year] DOA: 10/02/2014  PCP: Adella Hare, MD  Admit date: 10/02/2014 Discharge date: 10/03/2014  Time spent:45minutes  1. Transfer to [facility]  Discharge Diagnoses:  Principal Problem:   IVC thrombosis Active Problems:   Pulmonary emboli   CHF (congestive heart failure)   Dextrocardia   Elevated serum hCG   Cardiomyopathy  Discharge Condition: Stable  Diet recommendation: Low-sodium, heart healthy  Filed Weights   10/02/14 2101 10/03/14 0300  Weight: 62.596 kg (138 lb) 64.003 kg (141 lb 1.6 oz)    History of present illness:  Chief Complaint: Chest Pain and SOB and Lower ABD Pain  HPI: [name] is a 24 y.o. female with a history of Dextrocardia, CHF, Pulmonary Emboli S/P IVC Filter on Eliquis Rx who presents to the ED with complaints of chest pain and SOB that started a few hours prior to coming to the ED. Her pain was sharp and worse with deep breaths. She also reports having ABD Pain for the past 2-3 days. She reports that she has missed several doses of her Eliquis Rx. And she was evaluated in the ED and was found to have Thrombus in the Inferior Vena Cava and Multiple small Pulmonary Emboli as well as a RLL Loculated Pleural effusion. She was started on and IV Heparin drip and referred for admission. A Serum HCG was performed and was found to be elevated.   Hospital Course:   IVC thrombosis, Pulm emboli -acute vs chronic, Now on SQ lovenox, was admitted earlier at 4am today -h/o multiple PEs, hemodynamically stable -Admit to compliance with Eliquis -will  transfer her to [facility] where she gets her medical care   Positive serum pregnancy -Beta-hCG was positive at 9.8, she underwent an OB ultrasound which did not show any intrauterine pregnancy H normal uterus ovaries and adnexa Suspect false POSITIVE will need to be repeated in one  week-  Cardiomyopathy -Stable, diuretic on hold  CT scan yesterday also showed a 6.2 x 2.1 cm septum diaphragmatic fluid collection in the right -This was discussed with radiologist on call Dr. Littie Deeds who thinks this is most likely ascites  Discharge Exam: Filed Vitals:   10/03/14 0300  BP: 148/99  Pulse: 78  Temp: 98 F (36.7 C)  Resp: 18    General:AAOx3 Cardiovascular: S1S2/RRR Respiratory: CTAB  Discharge Instructions   Discharge Instructions    Diet - low sodium heart healthy    Complete by:  As directed      Increase activity slowly    Complete by:  As directed           Current Discharge Medication List    START taking these medications   Details  enoxaparin (LOVENOX) 150 MG/ML injection Inject 0.43 mLs (65 mg total) into the skin every 12 (twelve) hours. Qty: 0 Syringe      CONTINUE these medications which have NOT CHANGED   Details  aspirin 81 MG chewable tablet Chew 81 mg by mouth daily.    citalopram (CELEXA) 20 MG tablet Take 20 mg by mouth daily.    furosemide (LASIX) 20 MG tablet Take 20 mg by mouth daily.    levETIRAcetam (KEPPRA) 500 MG tablet Take 500 mg by mouth daily.    lisinopril (PRINIVIL,ZESTRIL) 2.5 MG tablet Take 2.5 mg by mouth daily.    metoprolol tartrate (LOPRESSOR) 25 MG tablet Take 25 mg by mouth 2 (two) times daily.    spironolactone (  ALDACTONE) 25 MG tablet Take 25 mg by mouth daily.      STOP taking these medications     apixaban (ELIQUIS) 5 MG TABS tablet        Allergies  Allergen Reactions  . Percocet [Oxycodone-Acetaminophen] Hives      The results of significant diagnostics from this hospitalization (including imaging, microbiology, ancillary and laboratory) are listed below for reference.    Significant Diagnostic Studies: Dg Chest 2 View  10/02/2014   CLINICAL DATA:  Chest pain. Mid sternal sharp chest pain. Abdominal pain. Dextro cardia.  EXAM: CHEST - 2 VIEW  COMPARISON:  None.  FINDINGS: The heart  is in the right chest. Left-sided aortic arch is present. Embolization coils are present. Lungs are clear. An IVC filter is in place. Levoconvex curvature is present in the upper thoracic spine.  IMPRESSION: 1. Dextro cardia.  A left-sided aortic arch is present. 2. No acute cardiopulmonary disease. 3. Leftward curvature of the upper thoracic spine.   Electronically Signed   By: Marin Roberts M.D.   On: 10/02/2014 21:49   Ct Angio Chest Pe W/cm &/or Wo Cm  10/03/2014   CLINICAL DATA:  Chest pain. IVC filter. Shortness of breath. Dextro cardia.  EXAM: CT ANGIOGRAPHY CHEST WITH CONTRAST  TECHNIQUE: Multidetector CT imaging of the chest was performed using the standard protocol during bolus administration of intravenous contrast. Multiplanar CT image reconstructions and MIPs were obtained to evaluate the vascular anatomy.  CONTRAST:  80 mL Omnipaque 350  COMPARISON:  Two-view chest x-ray 10/02/2014.  FINDINGS: Dextro cardia is present. The posterior right ventricle supplies a left aortic arch. A ventricular septal defect is suggested. Atrial septal defect is also suggested.  Pulmonary arteries are in direct communication with the SVC. There is extensive thrombus within the IVC extending to the superior aspect of the heart.  A left lower lobe pulmonary embolus is noted. This is nonocclusive. No other focal emboli present on the left. A smaller nonocclusive embolus is present at the right lower lobe.  The thoracic inlet is within normal limits. The soft this is normal. The liver is markedly enlarged. Stomach is on the left.  A loculated pleural fluid is present in the right lower lobe. Mild dependent atelectasis is present bilaterally. A subdiaphragmatic fluid collection on the right measures 6.2 x 2.1 cm.  Bone windows demonstrate scoliosis with leftward curvature of the upper thoracic spine.  Review of the MIP images confirms the above findings.  IMPRESSION: 1. 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  2. Extensive thrombus within the IVC despite a lower IVC filter. 3. The pulmonary arteries are fed directly from the SVC (fontan procedure). 4. Small nonocclusive emboli are present within the lower lobe pulmonary emboli and pulmonary arteries bilaterally. 5. Loculated right lower lobe pleural fluid. 6. 6.2 x 2.1 cm subdiaphragmatic fluid collection on the right. 7. 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 These results were called by telephone at the time of interpretation on 10/03/2014 at 1:15 am to Dr. Littie Deeds, who verbally acknowledged these results.   Electronically Signed   By: Marin Roberts M.D.   On: 10/03/2014 01:19   US Ob Comp Less 14 Wks  10/03/2014   CLINICAL DATA:  Confirmation of pregnancy  EXAM: OBSTETRIC <14 WK [place] AND TRANSVAGINAL OB US  TECHNIQUE: Both transabdominal and transvaginal ultrasound examinations were performed for complete evaluation of the gestation as well as the maternal uterus, adnexal regions, and pelvic cul-de-sac.  Transvaginal technique was performed to assess early pregnancy.  COMPARISON:  None.  FINDINGS: Intrauterine gestational sac: None  Yolk sac:  None  Embryo:  None  Maternal uterus/adnexae: Normal uterus measuring 8.4 x 3.8 x 3.6 cm.Normal bilateral ovaries. No adnexal mass. Multiple right ovarian follicles. Small amount of pelvic free fluid.  IMPRESSION: 1. No intrauterine pregnancy identified. Recommend clinical correlation, serial quantitative beta HCGs and followup ultrasound as clinically indicated.   Electronically Signed   By: Elige Ko   On: 10/03/2014 10:06   US Ob Transvaginal  10/03/2014   CLINICAL DATA:  Confirmation of pregnancy  EXAM: OBSTETRIC <14 WK ULTRASOUND  TECHNIQUE: Transabdominal ultrasound was performed for evaluation of the gestation as well as the maternal uterus and adnexal regions.  COMPARISON:  None.  FINDINGS: Intrauterine gestational sac: Not visualize  Yolk sac:  None  Embryo:  None   Cardiac Activity: None  Maternal uterus/adnexae: Normal uterus measuring 8.4 x 3.8 x 3.6 cm. Normal bilateral ovaries. No adnexal mass. Multiple right ovarian follicles. Small amount of pelvic free fluid.  IMPRESSION: 1. No intrauterine pregnancy identified. Recommend clinical correlation, serial quantitative beta HCGs and followup ultrasound as clinically indicated.   Electronically Signed   By: Elige Ko   On: 10/03/2014 09:40    Microbiology: Recent Results (from the past 240 hour(s))  MRSA PCR Screening     Status: None   Collection Time: 10/03/14  3:32 AM  Result Value Ref Range Status   MRSA by PCR NEGATIVE NEGATIVE Final    Comment:        The GeneXpert MRSA Assay (FDA approved for NASAL specimens only), is one component of a comprehensive MRSA colonization surveillance program. It is not intended to diagnose MRSA infection nor to guide or monitor treatment for MRSA infections.      Labs: Basic Metabolic Panel:  Recent Labs Lab 10/02/14 2122 10/03/14 0505  NA 134* 132*  K 4.2 3.6  CL 105 104  CO2 20* 23  GLUCOSE 77 94  BUN 11 10  CREATININE 0.64 0.70  CALCIUM 8.6* 8.7*   Liver Function Tests: No results for input(s): AST, ALT, ALKPHOS, BILITOT, PROT, ALBUMIN in the last 168 hours. No results for input(s): LIPASE, AMYLASE in the last 168 hours. No results for input(s): AMMONIA in the last 168 hours. CBC:  Recent Labs Lab 10/02/14 2122 10/03/14 0505  WBC 6.1 6.6  HGB 11.2* 11.0*  HCT 35.1* 33.8*  MCV 73.0* 70.7*  PLT 166 261   Cardiac Enzymes: No results for input(s): CKTOTAL, CKMB, CKMBINDEX, TROPONINI in the last 168 hours. BNP: BNP (last 3 results)  Recent Labs  10/02/14 2122  BNP 49.7    ProBNP (last 3 results) No results for input(s): PROBNP in the last 8760 hours.  CBG: No results for input(s): GLUCAP in the last 168 hours.     SignedZannie Cove  [facility] 10/03/2014, 12:07 PM

## 2014-10-03 NOTE — ED Notes (Signed)
Admitting at bedside 

## 2014-10-14 DIAGNOSIS — K7460 Unspecified cirrhosis of liver: Secondary | ICD-10-CM

## 2014-10-14 DIAGNOSIS — Z7901 Long term (current) use of anticoagulants: Secondary | ICD-10-CM | POA: Insufficient documentation

## 2014-10-14 DIAGNOSIS — F329 Major depressive disorder, single episode, unspecified: Secondary | ICD-10-CM | POA: Insufficient documentation

## 2014-10-14 DIAGNOSIS — F32A Depression, unspecified: Secondary | ICD-10-CM | POA: Insufficient documentation

## 2014-10-16 ENCOUNTER — Inpatient Hospital Stay: Payer: Self-pay | Admitting: Unknown Physician Specialty

## 2015-06-21 DEATH — deceased

## 2015-07-12 IMAGING — CT CT ABD-PELV W/ CM
2 of 10 series · 15 of 37 positions shown, 18 images · IV contrast (APPLIED)
Comparison: Chest CT dated 10/21/2013 and abdominal CT dated
08/20/2012

CLINICAL DATA: Hypoxia and chest pain. Abdominal pain. History of
pulmonary emboli. History of dextro cardia with open heart surgery
at age 1.

EXAM:
CT ANGIOGRAPHY CHEST WITH CONTRAST AND CT SCAN OF THE ABDOMEN WITH
CONTRAST
TECHNIQUE: Multidetector CT imaging of the chest was performed using the
standard protocol during bolus administration of intravenous
contrast. Multiplanar CT image reconstructions and MIPs were
obtained to evaluate the vascular anatomy.
CONTRAST:  75 cc Isovue 370

[Series 5: routine abd pel with · axial · 0.67mm/px · z∈[-528,-288]mm · 3 of 97 slices shown]
[im 25/97  lung]
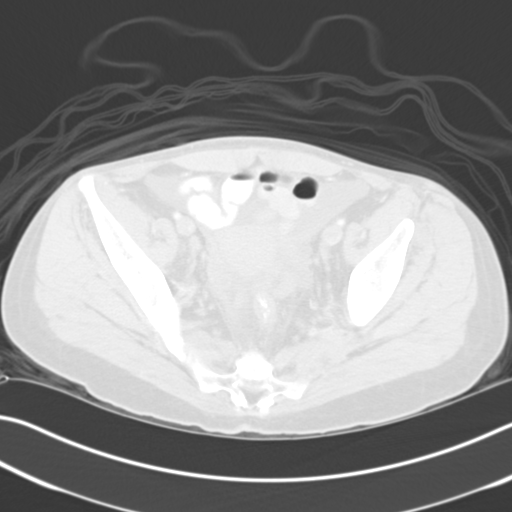
[im 49/97  lung]
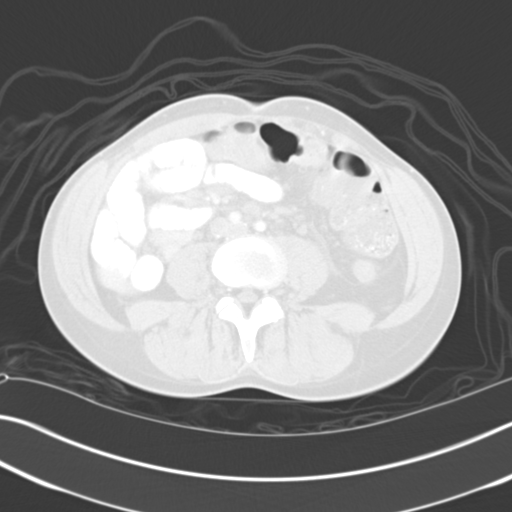
[im 73/97  lung]
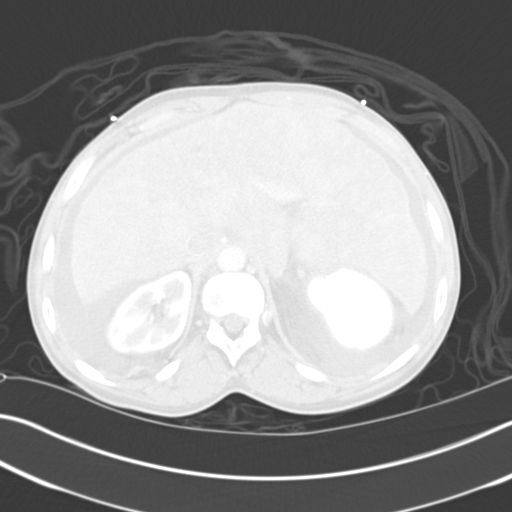

[Series 7: pe 1.0 thins · axial · 0.68mm/px · z∈[-272,-56]mm · 12 of 256 slices shown, 15 images]
[im 20/256  mediastinal]
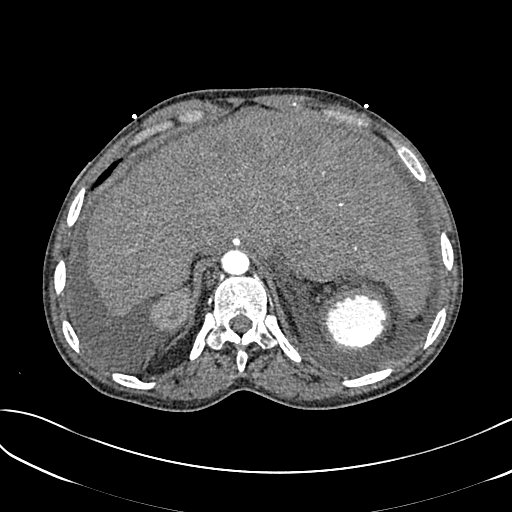
[im 20/256  lung]
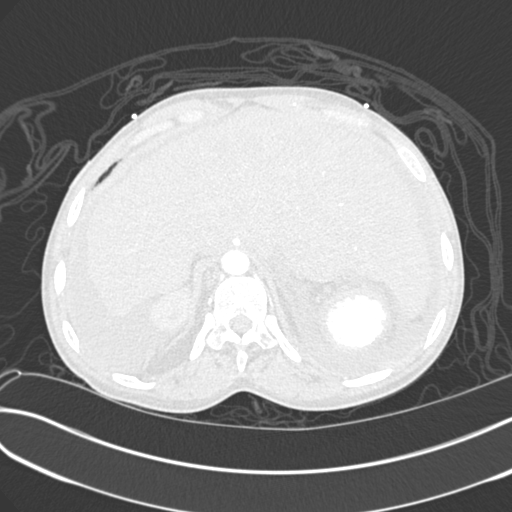
[im 40/256  lung]
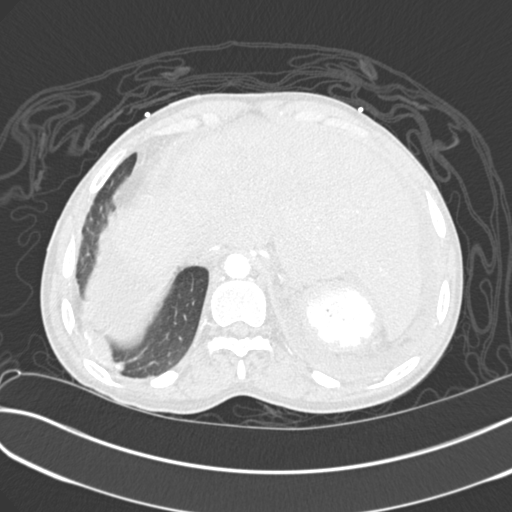
[im 59/256  lung]
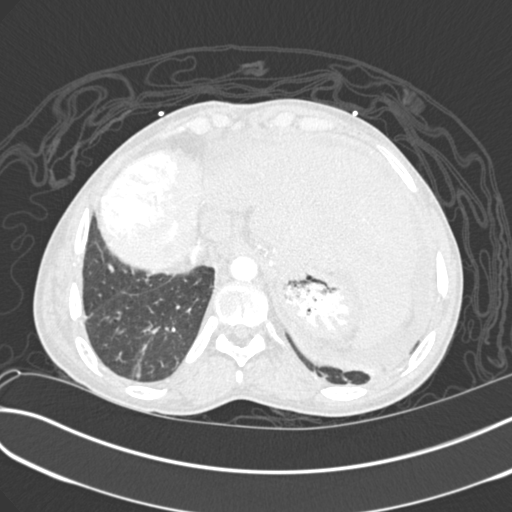
[im 79/256  lung]
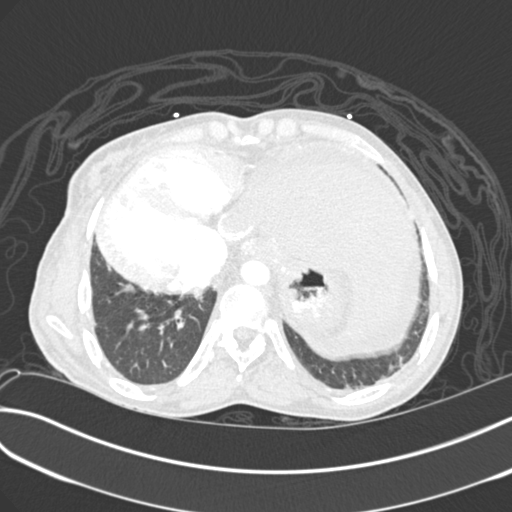
[im 99/256  mediastinal]
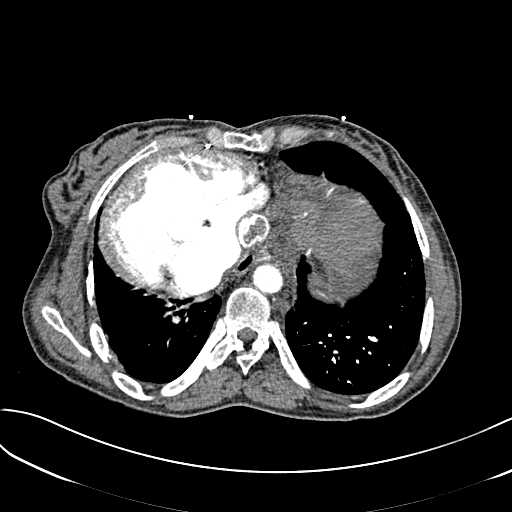
[im 99/256  lung]
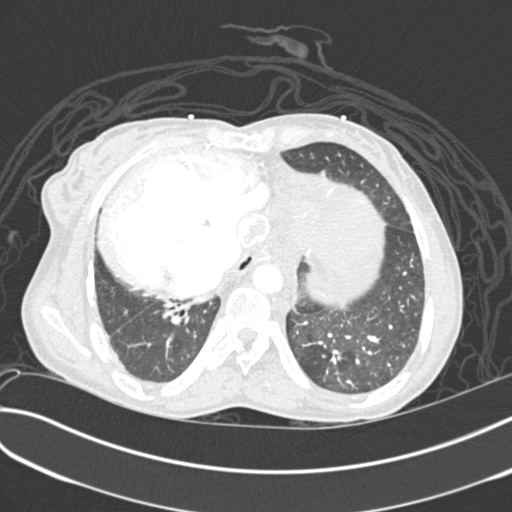
[im 118/256  lung]
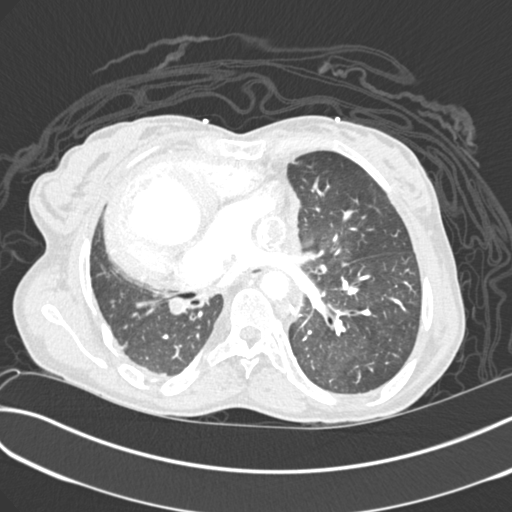
[im 138/256  lung]
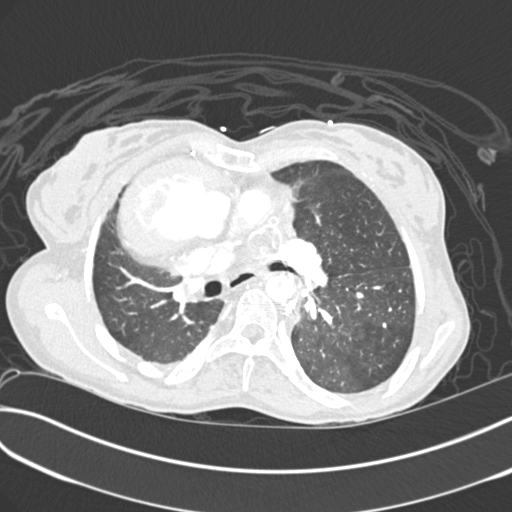
[im 157/256  lung]
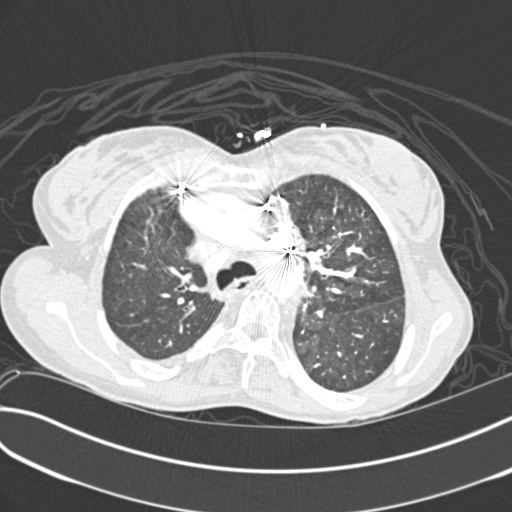
[im 177/256  mediastinal]
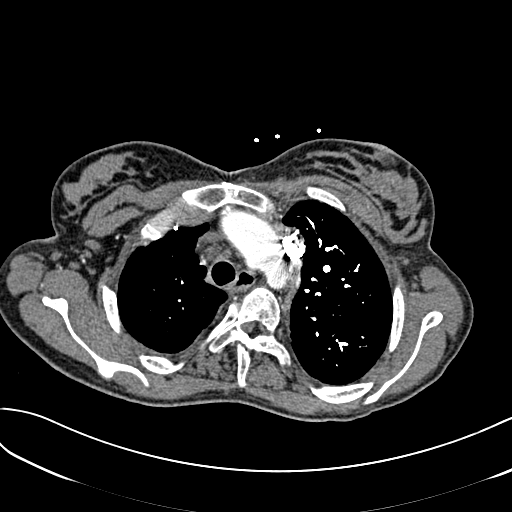
[im 177/256  lung]
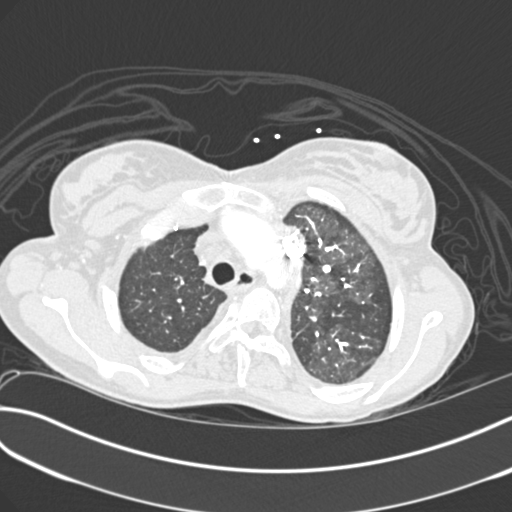
[im 197/256  lung]
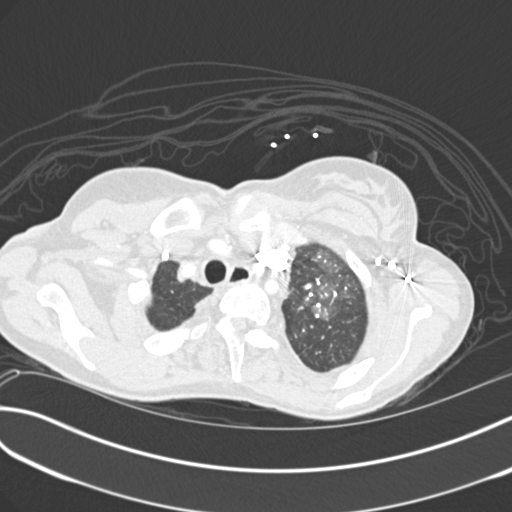
[im 216/256  lung]
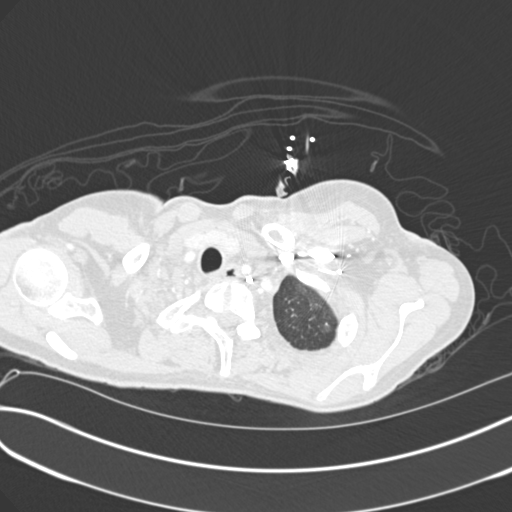
[im 236/256  lung]
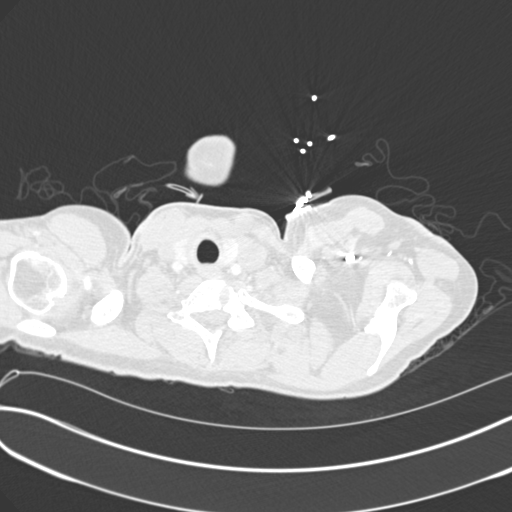

[15 of 37 positions shown; findings below may reference images not displayed]

CTA OF THE CHEST FINDINGS:
The patient has developed bilateral pulmonary emboli since the prior
CT scan of 10/21/2013. The largest clot is in the pulmonary artery
to the right lower lobe. Sulcal small clot in the left lower lobe.
There appears to be thrombus in the right main pulmonary artery.
Cardiac anatomy is markedly distorted. There is thrombus in the left
main pulmonary artery. There is thrombus in the left middle lobe
artery. The patient has 3 lobes in the left lung.

The patient has dextrocardia. There are no infiltrates or effusions.
Evidence of previous cardiac surgery. Moderate upper thoracic
scoliosis.

Review of the MIP images confirms the above findings.

CT SCAN OF THE ABDOMEN:
FINDINGS: There is marked hepatomegaly with diffuse inhomogeneity with diffuse
nodularity throughout the liver. There are some hyper enhancing
nodules. There are no dilated bile ducts.

The gallbladder appears normal. There is no visible spleen. The body
and tail of the pancreas are normal. The head of the pancreas is not
apparent.

The patient has fairly extensive ascites. There is a filter in the
inferior vena cava. The right kidney and right adrenal gland appear
normal. The left kidney appears normal. The left adrenal gland is
not identified.
IMPRESSION: [:
IMPRESSION: [
1. Bilateral new pulmonary emboli.
2. Dextrocardia with marked congenital anomalies of the heart.
Critical Value/emergent results were called by telephone at the time
of interpretation on 11/02/2013 at [DATE] to Dr. ARMOND ARSHAD ,
who verbally acknowledged these results.
3. Stable appearance of the abdomen. Diffuse ascites. Diffuse
enlargement of the liver with multiple poorly defined nodules which
could represent regenerating nodules or focal nodular hyperplasia or
adenomas. The appearance is essentially unchanged since 08/20/2012.

## 2015-07-12 IMAGING — CR DG CHEST 1V PORT
1 series · 1 of 1 positions shown · non-contrast
Comparison: CT chest 10/21/2013 and chest radiograph 10/21/2013

CLINICAL DATA: Situs inversus.  Shortness of breath and chest pain.

EXAM:
PORTABLE CHEST - 1 VIEW

[ap]
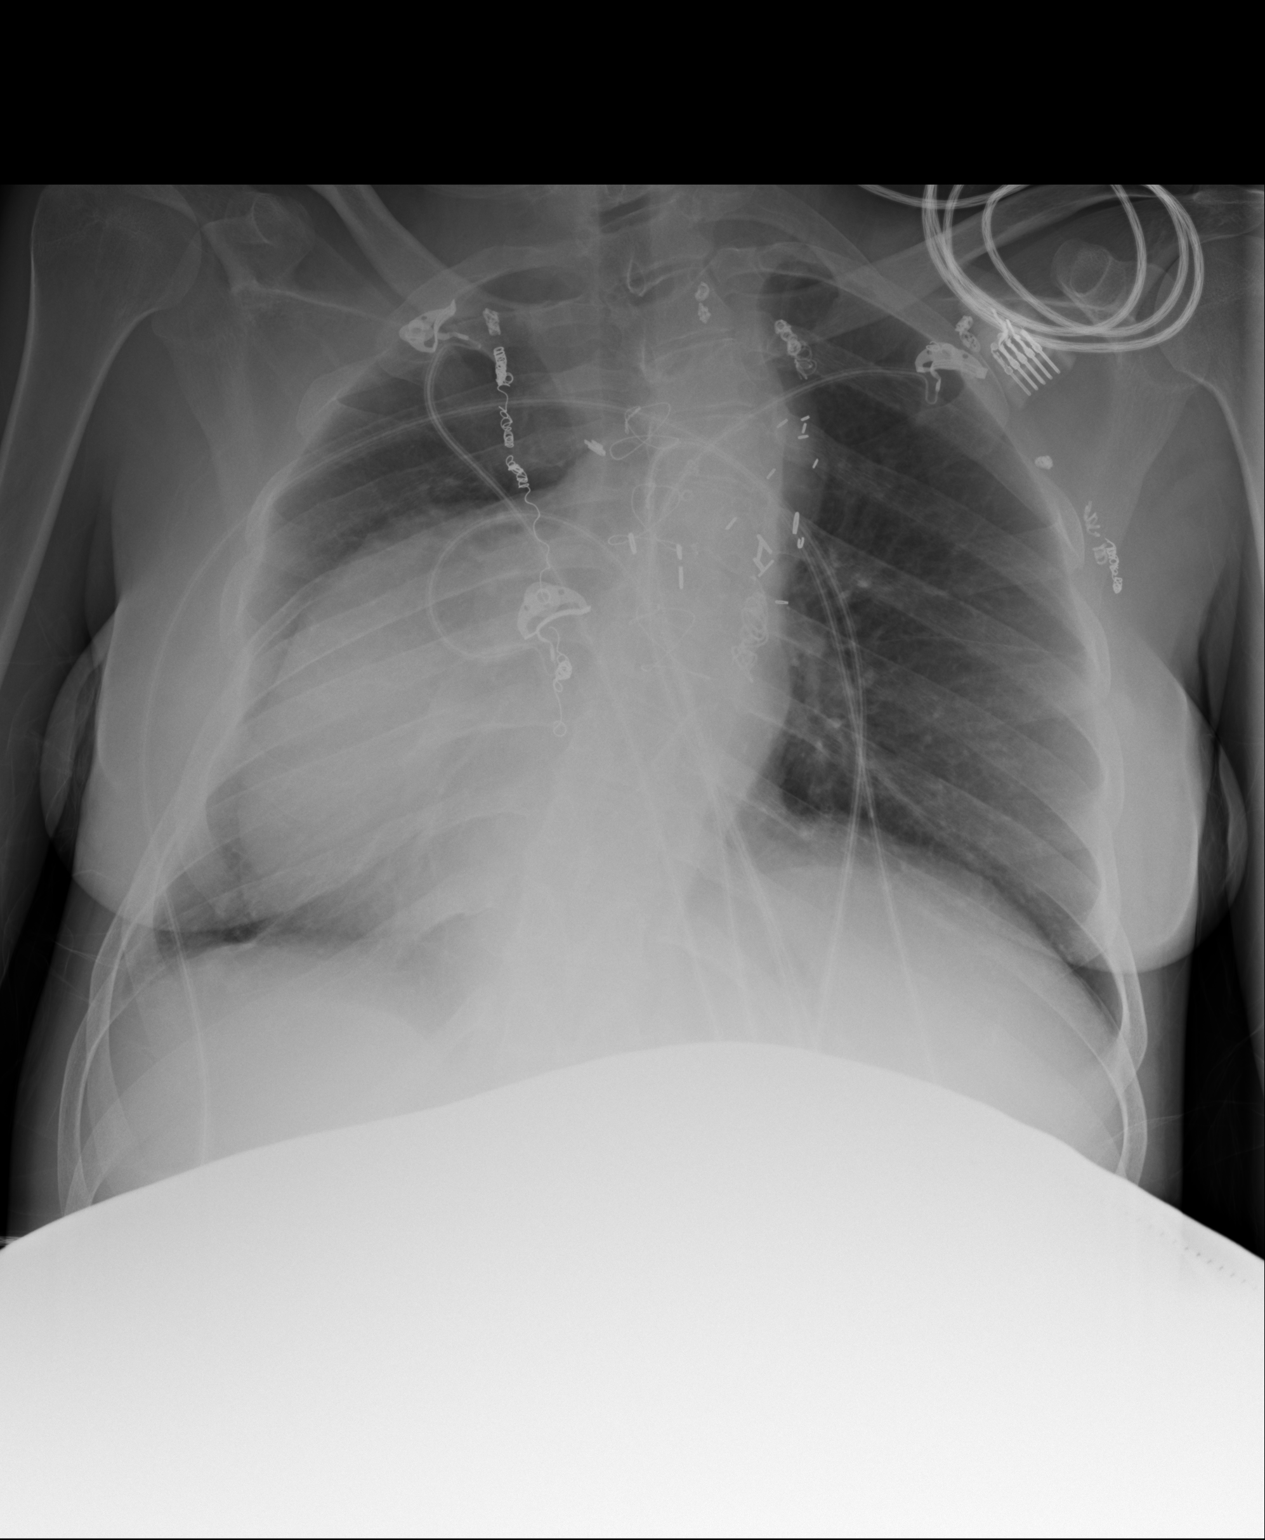

[1 of 1 positions shown; findings below may reference images not displayed]

FINDINGS: Dextrocardia. Cardiac silhouette appears enlarged and stable. There
changes of median sternotomy for prior cardiac surgery. Left-sided
aortic arch. Pulmonary vascularity appears within normal limits.
Scarring at the right lung base. No airspace disease or definite
pleural effusion. Negative for pneumothorax.

No acute osseous abnormality identified. Convex left scoliosis of
the upper thoracic spine noted.
IMPRESSION: Dextrocardia. Cardiomegaly and prior cardiac surgery. No acute
abnormality identified in the chest.
